# Patient Record
Sex: Male | Born: 1970
Health system: Southern US, Community
[De-identification: ages and names within clinical notes are randomized; demographics above are authoritative.]

## PROBLEM LIST (undated history)

## (undated) DIAGNOSIS — I10 Essential (primary) hypertension: Secondary | ICD-10-CM

## (undated) DIAGNOSIS — F419 Anxiety disorder, unspecified: Secondary | ICD-10-CM

## (undated) DIAGNOSIS — I2510 Atherosclerotic heart disease of native coronary artery without angina pectoris: Secondary | ICD-10-CM

## (undated) DIAGNOSIS — Z9289 Personal history of other medical treatment: Secondary | ICD-10-CM

## (undated) DIAGNOSIS — E785 Hyperlipidemia, unspecified: Secondary | ICD-10-CM

## (undated) DIAGNOSIS — Z720 Tobacco use: Secondary | ICD-10-CM

## (undated) HISTORY — PX: APPENDECTOMY: SHX54

## (undated) HISTORY — DX: Hyperlipidemia, unspecified: E78.5

## (undated) HISTORY — DX: Anxiety disorder, unspecified: F41.9

## (undated) HISTORY — DX: Personal history of other medical treatment: Z92.89

## (undated) HISTORY — DX: Atherosclerotic heart disease of native coronary artery without angina pectoris: I25.10

---

## 2006-05-16 ENCOUNTER — Observation Stay: Payer: Self-pay | Admitting: General Surgery

## 2006-06-15 ENCOUNTER — Ambulatory Visit: Payer: Self-pay | Admitting: General Surgery

## 2006-07-01 ENCOUNTER — Other Ambulatory Visit: Payer: Self-pay

## 2006-07-01 ENCOUNTER — Inpatient Hospital Stay: Payer: Self-pay | Admitting: Internal Medicine

## 2006-07-03 ENCOUNTER — Inpatient Hospital Stay: Payer: Self-pay | Admitting: Psychiatry

## 2007-03-11 ENCOUNTER — Ambulatory Visit: Payer: Self-pay | Admitting: Emergency Medicine

## 2008-01-12 ENCOUNTER — Ambulatory Visit: Payer: Self-pay | Admitting: Family Medicine

## 2008-05-08 ENCOUNTER — Ambulatory Visit: Payer: Self-pay | Admitting: Internal Medicine

## 2008-08-06 ENCOUNTER — Ambulatory Visit: Payer: Self-pay | Admitting: Internal Medicine

## 2010-04-13 ENCOUNTER — Ambulatory Visit: Payer: Self-pay | Admitting: Internal Medicine

## 2015-05-07 ENCOUNTER — Emergency Department
Admission: EM | Admit: 2015-05-07 | Discharge: 2015-05-07 | Disposition: A | Discharge status: Home / Self Care | Payer: 59 | Attending: Emergency Medicine | Admitting: Emergency Medicine

## 2015-05-07 ENCOUNTER — Encounter: Payer: Self-pay | Admitting: Emergency Medicine

## 2015-05-07 ENCOUNTER — Emergency Department: Payer: 59

## 2015-05-07 DIAGNOSIS — N50811 Right testicular pain: Secondary | ICD-10-CM | POA: Diagnosis not present

## 2015-05-07 DIAGNOSIS — Z880 Allergy status to penicillin: Secondary | ICD-10-CM | POA: Insufficient documentation

## 2015-05-07 DIAGNOSIS — R1031 Right lower quadrant pain: Secondary | ICD-10-CM | POA: Diagnosis present

## 2015-05-07 DIAGNOSIS — F17200 Nicotine dependence, unspecified, uncomplicated: Secondary | ICD-10-CM | POA: Diagnosis not present

## 2015-05-07 LAB — URINALYSIS COMPLETE WITH MICROSCOPIC (ARMC ONLY)
Bacteria, UA: NONE SEEN
Bilirubin Urine: NEGATIVE
GLUCOSE, UA: NEGATIVE mg/dL
Hgb urine dipstick: NEGATIVE
Ketones, ur: NEGATIVE mg/dL
Leukocytes, UA: NEGATIVE
Nitrite: NEGATIVE
PROTEIN: NEGATIVE mg/dL
SQUAMOUS EPITHELIAL / LPF: NONE SEEN
Specific Gravity, Urine: 1.006 (ref 1.005–1.030)
pH: 7 (ref 5.0–8.0)

## 2015-05-07 MED ORDER — TRAMADOL HCL 50 MG PO TABS
50.0000 mg | ORAL_TABLET | Freq: Four times a day (QID) | ORAL | Status: DC | PRN
Start: 2015-05-07 — End: 2016-05-19

## 2015-05-07 MED ORDER — CIPROFLOXACIN HCL 500 MG PO TABS: 500.0000 mg | ORAL_TABLET | Freq: Two times a day (BID) | ORAL | Status: AC

## 2015-05-07 MED ORDER — CLONIDINE HCL 0.2 MG PO TABS: 0.2000 mg | ORAL_TABLET | Freq: Two times a day (BID) | ORAL | Status: DC | PRN

## 2015-05-07 MED ORDER — CLONIDINE HCL 0.1 MG PO TABS
0.1000 mg | ORAL_TABLET | Freq: Once | ORAL | Status: AC
  Administered 2015-05-07: 0.1 mg via ORAL
  Filled 2015-05-07: qty 1

## 2015-05-07 NOTE — Discharge Instructions (Signed)
Abdominal Pain, Adult °Many things can cause belly (abdominal) pain. Most times, the belly pain is not dangerous. Many cases of belly pain can be watched and treated at home. °HOME CARE  °· Do not take medicines that help you go poop (laxatives) unless told to by your doctor. °· Only take medicine as told by your doctor. °· Eat or drink as told by your doctor. Your doctor will tell you if you should be on a special diet. °GET HELP IF: °· You do not know what is causing your belly pain. °· You have belly pain while you are sick to your stomach (nauseous) or have runny poop (diarrhea). °· You have pain while you pee or poop. °· Your belly pain wakes you up at night. °· You have belly pain that gets worse or better when you eat. °· You have belly pain that gets worse when you eat fatty foods. °· You have a fever. °GET HELP RIGHT AWAY IF:  °· The pain does not go away within 2 hours. °· You keep throwing up (vomiting). °· The pain changes and is only in the right or left part of the belly. °· You have bloody or tarry looking poop. °MAKE SURE YOU:  °· Understand these instructions. °· Will watch your condition. °· Will get help right away if you are not doing well or get worse. °  °This information is not intended to replace advice given to you by your health care provider. Make sure you discuss any questions you have with your health care provider. °  °Document Released: 10/22/2007 Document Revised: 05/26/2014 Document Reviewed: 01/12/2013 °Elsevier Interactive Patient Education ©2016 [employer] ° °Please return immediately if condition worsens. Please contact her primary physician or the physician you were given for referral. If you have any specialist physicians involved in her treatment and plan please also contact them. Thank you for using [facility]. ° °

## 2015-05-07 NOTE — ED Notes (Signed)
Pt complains of right groin pain, pt reports pain is worse when at the gym, pt reports pain has been present for 3 weeks, pt denies any other symptoms

## 2015-05-07 NOTE — ED Notes (Signed)
Patient transported to CT 

## 2015-05-07 NOTE — ED Notes (Signed)
Pt presents with possible hernia right side groin area for three weeks. Has appt with Dr. Katrinka BlazingSmith on 05/25/15, but cannot wait that long, increased pain. Denies any problems with  Passing urine.

## 2015-05-07 NOTE — ED Provider Notes (Signed)
Time Seen: Approximately *0 945  I have reviewed the triage notes  Chief Complaint: Hernia   History of Present Illness: Nathan Gray is a 44 y.o. male who states he's had some right groin pain and right lateral testicular pain now for the last several weeks. He describes episodes where it's exacerbated by bending over. He was advised that this may be a right inguinal hernia and does have follow-up appointment scheduled with a general surgeon. He denies any significant masses toward the right scrotal sac and denies any trauma. He denies any exacerbation of pain with urination or bowel movements. He denies any fever at home and states he's been taking ibuprofen for pain. He presents with some hypertension and states he has no history of high blood pressure. He denies any chest pain or shortness of breath. He denies any significant persistent abdominal pain. He denies any recent penile discharge or drainage or skin lesions.   History reviewed. No pertinent past medical history.  There are no active problems to display for this patient.   Past Surgical History  Procedure Laterality Date  . Appendectomy      Past Surgical History  Procedure Laterality Date  . Appendectomy      Current Outpatient Rx  Name  Route  Sig  Dispense  Refill  . ciprofloxacin (CIPRO) 500 MG tablet   Oral   Take 1 tablet (500 mg total) by mouth 2 (two) times daily.   14 tablet   0   . cloNIDine (CATAPRES) 0.2 MG tablet   Oral   Take 1 tablet (0.2 mg total) by mouth 2 (two) times daily as needed.   20 tablet   11   . traMADol (ULTRAM) 50 MG tablet   Oral   Take 1 tablet (50 mg total) by mouth every 6 (six) hours as needed.   20 tablet   0     Allergies:  Penicillins  Family History: No family history on file.  Social History: Social History  Substance Use Topics  . Smoking status: Current Some Day Smoker  . Smokeless tobacco: None  . Alcohol Use: Yes     Review of Systems:    10 point review of systems was performed and was otherwise negative:  Constitutional: No fever Eyes: No visual disturbances ENT: No sore throat, ear pain Cardiac: No chest pain Respiratory: No shortness of breath, wheezing, or stridor Abdomen: No abdominal pain, no vomiting, No diarrhea Endocrine: No weight loss, No night sweats Extremities: No peripheral edema, cyanosis Skin: No rashes, easy bruising Neurologic: No focal weakness, trouble with speech or swollowing Urologic: No dysuria, Hematuria, or urinary frequency   Physical Exam:  ED Triage Vitals  Enc Vitals Group     BP 05/07/15 0835 175/104 mmHg     Pulse Rate 05/07/15 0835 87     Resp 05/07/15 0835 18     Temp 05/07/15 0835 97.8 F (36.6 C)     Temp Source 05/07/15 0835 Oral     SpO2 05/07/15 0835 100 %     Weight 05/07/15 0835 160 lb (72.576 kg)     Height 05/07/15 0835  (1.803 m)     Head Cir --      Peak Flow --      Pain Score 05/07/15 0841 6     Pain Loc --      Pain Edu? --      Excl. in GC? --     General: Awake ,  Alert , and Oriented times 3; GCS 15 Head: Normal cephalic , atraumatic Eyes: Pupils equal , round, reactive to light Nose/Throat: No nasal drainage, patent upper airway without erythema or exudate.  Neck: Supple, Full range of motion, No anterior adenopathy or palpable thyroid masses Lungs: Clear to ascultation without wheezes , rhonchi, or rales Heart: Regular rate, regular rhythm without murmurs , gallops , or rubs Abdomen: Soft, non tender without rebound, guarding , or rigidity; bowel sounds positive and symmetric in all 4 quadrants. No organomegaly .        Extremities: 2 plus symmetric pulses. No edema, clubbing or cyanosis Neurologic: normal ambulation, Motor symmetric without deficits, sensory intact Skin: warm, dry, no rashes Patient has some mild tenderness in the right lateral surface of the scrotal sac. His testes appears to be normally positioned and nontender itself.  There is no palpable hernias within the scrotal sac.  Labs:   All laboratory work was reviewed including any pertinent negatives or positives listed below:  Labs Reviewed  URINALYSIS COMPLETEWITH MICROSCOPIC ([facility] ONLY) - Abnormal; Notable for the following:    Color, Urine YELLOW (*)    APPearance CLEAR (*)    All other components within normal limits       Radiology:  EXAM: CT ABDOMEN AND PELVIS WITHOUT CONTRAST  TECHNIQUE: Multidetector CT imaging of the abdomen and pelvis was performed following the standard protocol without IV contrast.  COMPARISON: None.  FINDINGS: The lung bases are clear. There is no pleural or pericardial effusion.  The kidneys appear normal bilaterally. No renal or ureteral stones are seen on the right or left. The gallbladder, liver, spleen, adrenal glands and pancreas all appear normal.  There is advanced for age appearing aortoiliac atherosclerosis without aneurysm. No lymphadenopathy or fluid is identified. The patient is status post appendectomy. The stomach and small and large bowel appear normal. No hernia is seen. No focal bony abnormality is identified.  IMPRESSION: No acute abnormality or finding to explain the patient's symptoms.  Advanced for age appearing aortoiliac atherosclerosis without aneurysm.       I personally reviewed the radiologic studies    ED Course:  Patient's stay here was uneventful in his urine and abdominal CT showed no abnormalities. Under consideration was a urinary tract infection, kidney stone, etc. He could have a hernia though it doesn't seem to be obvious on physical exam. He also has some mild tenderness on the lateral surface of his testicle and this may be some epididymitis or orchitis. He certainly doesn't have a fever or obvious swelling in the scrotal sac itself. Patient be placed on Cipro for precaution and was given Ultram for pain. He also was persistently hypertensive and was given  clonidine and will be discharged on clonidine for outpatient workup. He was advised continue with his appointment for hernia evaluation and referred to his primary physician for further evaluation of his hypertension. May be situational hypertension is patient's somewhat anxious and at times a very poor historian. He was advised to obtain some form of male supportive garment such as a Tourist information centre managerathletic supporter.     Assessment: * Right groin pain of uncertain etiology  Final Clinical Impression:   Final diagnoses:  Groin pain, right     Plan: * Outpatient management Patient was advised to return immediately if condition worsens. Patient was advised to follow up with their primary care physician or other specialized physicians involved in their outpatient care  Jennye Moccasin, MD 05/07/15 [phone]

## 2016-02-09 ENCOUNTER — Encounter (HOSPITAL_COMMUNITY): Payer: Self-pay

## 2016-02-09 ENCOUNTER — Emergency Department (HOSPITAL_COMMUNITY): Payer: 59

## 2016-02-09 ENCOUNTER — Emergency Department (HOSPITAL_COMMUNITY)
Admission: EM | Admit: 2016-02-09 | Discharge: 2016-02-09 | Disposition: A | Discharge status: Home / Self Care | Payer: 59 | Attending: Emergency Medicine | Admitting: Emergency Medicine

## 2016-02-09 DIAGNOSIS — Y939 Activity, unspecified: Secondary | ICD-10-CM | POA: Diagnosis not present

## 2016-02-09 DIAGNOSIS — Y92410 Unspecified street and highway as the place of occurrence of the external cause: Secondary | ICD-10-CM | POA: Insufficient documentation

## 2016-02-09 DIAGNOSIS — M25511 Pain in right shoulder: Secondary | ICD-10-CM

## 2016-02-09 DIAGNOSIS — S7011XA Contusion of right thigh, initial encounter: Secondary | ICD-10-CM | POA: Diagnosis not present

## 2016-02-09 DIAGNOSIS — F17200 Nicotine dependence, unspecified, uncomplicated: Secondary | ICD-10-CM | POA: Insufficient documentation

## 2016-02-09 DIAGNOSIS — S79921A Unspecified injury of right thigh, initial encounter: Secondary | ICD-10-CM | POA: Diagnosis present

## 2016-02-09 DIAGNOSIS — M79651 Pain in right thigh: Secondary | ICD-10-CM

## 2016-02-09 DIAGNOSIS — Y999 Unspecified external cause status: Secondary | ICD-10-CM | POA: Diagnosis not present

## 2016-02-09 DIAGNOSIS — R52 Pain, unspecified: Secondary | ICD-10-CM

## 2016-02-09 MED ORDER — OXYCODONE-ACETAMINOPHEN 5-325 MG PO TABS: 1.0000 | ORAL_TABLET | Freq: Four times a day (QID) | ORAL | 0 refills | Status: DC | PRN

## 2016-02-09 MED ORDER — HYDROCODONE-ACETAMINOPHEN 5-325 MG PO TABS: 1.0000 | ORAL_TABLET | Freq: Four times a day (QID) | ORAL | 0 refills | Status: DC | PRN

## 2016-02-09 MED ORDER — HYDROCODONE-ACETAMINOPHEN 5-325 MG PO TABS: 2.0000 | ORAL_TABLET | Freq: Once | ORAL | Status: DC
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
## 2016-05-19 ENCOUNTER — Ambulatory Visit
Admission: EM | Admit: 2016-05-19 | Discharge: 2016-05-19 | Disposition: A | Discharge status: Home / Self Care | Payer: 59 | Attending: Emergency Medicine | Admitting: Emergency Medicine

## 2016-05-19 DIAGNOSIS — R000 Tachycardia, unspecified: Secondary | ICD-10-CM | POA: Diagnosis not present

## 2016-05-19 DIAGNOSIS — F419 Anxiety disorder, unspecified: Secondary | ICD-10-CM

## 2016-05-19 MED ORDER — CITALOPRAM HYDROBROMIDE 20 MG PO TABS: 20.0000 mg | ORAL_TABLET | Freq: Every day | ORAL | Status: DC

## 2016-05-19 NOTE — ED Provider Notes (Signed)
CSN: [id number]     Arrival date & time 05/19/16  1039 History   First MD Initiated Contact with Patient 05/19/16 1216     Chief Complaint  Patient presents with  . Anxiety   (Consider location/radiation/quality/duration/timing/severity/associated sxs/prior Treatment) Patient is a healthy 46 year old male, with no medical history and not currently on any medication, presents today for anxiety and rapid heartbeat. 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 Patient also reports a lot of stress at home. Patient states that his brothers is in poor health with upcoming leg amputation scheduled. His mom and dad are also in poor health and his friend recently passed away due to cancer. Patient states that he has been stressed for the past 5 months and reports that it is finally catching up on him. Patient reports that he started noticing rapid heartbeat about 3 weeks ago. Patient states that the rapid heartbeat is intermittent. Patient denies any cardiac history. Patient denies chest pain, shortness of breath, headache, dizziness and visual disturbances. Besides rapid heartbeat, patient also endorses irritability, poor sleep at night, and feeling shaky at times.       History reviewed. No pertinent past medical history. Past Surgical History:  Procedure Laterality Date  . APPENDECTOMY     History reviewed. No pertinent family history. Social History  Substance Use Topics  . Smoking status: Current Every Day Smoker    Packs/day: 0.50  . Smokeless tobacco: Never Used  . Alcohol use Yes     Comment: occasionally    Review of Systems  Constitutional:       As stated in the history of present illness.    Allergies  Amoxicillin and Penicillins  Home Medications   Prior to Admission medications   Not on File   Meds Ordered and Administered this Visit  Medications - No data to display  BP (!) 189/110 (BP Location: Left Arm)   Pulse 100   Temp 98 F  (36.7 C) (Oral)   Resp 17   Ht 5\' 11"  (1.803 m)   Wt 167 lb (75.8 kg)   SpO2 100%   BMI 23.29 kg/m  No data found.   Physical Exam  Constitutional: He is oriented to person, place, and time. He appears well-developed and well-nourished. No distress.  HENT:  Head: Normocephalic and atraumatic.  Right Ear: External ear normal.  Left Ear: External ear normal.  Nose: Nose normal.  Mouth/Throat: Oropharynx is clear and moist.  Eyes: EOM are normal. Pupils are equal, round, and reactive to light.  Neck: Normal range of motion. Neck supple.  Cardiovascular: Normal rate, regular rhythm and normal heart sounds.   Has S1 S2 RRR, with no murmur, rub or gallop. Has intact distal pulses.   Pulmonary/Chest: Effort normal and breath sounds normal. No respiratory distress. He has no wheezes.  Abdominal: Soft. Bowel sounds are normal. He exhibits no distension. There is no tenderness.  Musculoskeletal: Normal range of motion.  Neurological: He is alert and oriented to person, place, and time.  Skin: Skin is warm and dry. He is not diaphoretic.  Nursing note and vitals reviewed.   Urgent Care Course   Clinical Course     Procedures (including critical care time)  Labs Review Labs Reviewed - No data to display  Imaging Review No results found.   MDM   1. Anxiety    Sinus rhythm on EKG. There is no axis deviation. There is no ectopy, no evidence of heart  block or conduction disturbance, no bundle branch block, no pathological Q wave, no ST segment deviation, no T wave inversion, no evidence of pre-excitation or prolonged QT interval and no evidence of chamber enlargement.   Physical exam unremarkable. Symptoms are consistent with anxiety. Will start patient on Celexa 20 mg daily for anxiety an Ativan 0.5 mg twice daily only as needed (4 tablets given). [facility] registry reviewed and is appropriate. Patient informed to establish care with a PCP and f/u in 1 week for re-evaluation.    **Unable to print off the prescription for both Celexa and Ativan. Hand written prescriptions given.     Lucia EstelleFeng Antawn Sison, NP 05/19/16 1254

## 2016-05-19 NOTE — ED Triage Notes (Signed)
Patient complains of rapid heart rate. Patient states that he has been anxious recently. Patient states that he has a lot of family stressors at the current moment. Patient states that he has noticed that he has been unable to sleep. Patient states that at time he also has some lightheaded.

## 2016-05-28 ENCOUNTER — Encounter: Payer: Self-pay | Admitting: Internal Medicine

## 2016-05-28 ENCOUNTER — Ambulatory Visit (INDEPENDENT_AMBULATORY_CARE_PROVIDER_SITE_OTHER): Payer: 59 | Admitting: Internal Medicine

## 2016-05-28 VITALS — BP 106/96 | HR 86 | Temp 97.6°F | Ht 71.0 in | Wt 153.0 lb

## 2016-05-28 DIAGNOSIS — F17200 Nicotine dependence, unspecified, uncomplicated: Secondary | ICD-10-CM

## 2016-05-28 DIAGNOSIS — F419 Anxiety disorder, unspecified: Secondary | ICD-10-CM | POA: Insufficient documentation

## 2016-05-28 DIAGNOSIS — Z720 Tobacco use: Secondary | ICD-10-CM | POA: Insufficient documentation

## 2016-05-28 DIAGNOSIS — G4709 Other insomnia: Secondary | ICD-10-CM | POA: Insufficient documentation

## 2016-05-28 MED ORDER — LORAZEPAM 0.5 MG PO TABS: 0.5000 mg | ORAL_TABLET | Freq: Every evening | ORAL | 0 refills | Status: DC | PRN

## 2016-05-28 MED ORDER — CITALOPRAM HYDROBROMIDE 20 MG PO TABS: 20.0000 mg | ORAL_TABLET | Freq: Every day | ORAL | 1 refills | Status: DC

## 2016-05-28 NOTE — Progress Notes (Signed)
Date:  05/28/2016   Name:  Nathan Gray   DOB:  [birth year]   MRN:  [id number]   Chief Complaint: Establish Care and Anxiety Anxiety  Presents for initial visit. Onset was 6 to 12 months ago. The problem has been gradually worsening. Symptoms include chest pain, insomnia, irritability, nausea, nervous/anxious behavior and palpitations. Patient reports no decreased concentration, dizziness, shortness of breath or suicidal ideas.    More stress recently with promotion at work, dear friend with terminal cancer, brother with severe diabetes and upcoming amputation. Started celexa 20 mg 10 days ago.  He increased to 40 mg 2 days ago. Sleep is still bad.  Queezy stomach is improved.  Now feels like he is clenching his teeth and can't be still. He has no history of similar sx.  No distinct family history.   Review of Systems  Constitutional: Positive for irritability. Negative for chills and fatigue.  Eyes: Negative for visual disturbance.  Respiratory: Negative for cough, chest tightness, shortness of breath and wheezing.   Cardiovascular: Positive for chest pain and palpitations.  Gastrointestinal: Positive for nausea. Negative for abdominal pain and vomiting.  Neurological: Negative for dizziness and headaches.  Psychiatric/Behavioral: Positive for sleep disturbance. Negative for decreased concentration, dysphoric mood, hallucinations and suicidal ideas. The patient is nervous/anxious and has insomnia.     There are no active problems to display for this patient.   Prior to Admission medications   Medication Sig Start Date End Date Taking? Authorizing Provider  citalopram (CELEXA) 20 MG tablet  05/19/16  Yes Historical Provider, MD  LORazepam (ATIVAN) 0.5 MG tablet  05/19/16   Historical Provider, MD    Allergies  Allergen Reactions  . Amoxicillin   . Penicillins     Past Surgical History:  Procedure Laterality Date  . APPENDECTOMY      Social History  Substance Use  Topics  . Smoking status: Current Every Day Smoker    Packs/day: 0.50    Years: 18.00  . Smokeless tobacco: Current User  . Alcohol use Yes     Comment: occasionally     Medication list has been reviewed and updated.   Physical Exam  Constitutional: He is oriented to person, place, and time. He appears well-developed and well-nourished. No distress.  HENT:  Head: Normocephalic and atraumatic.  Eyes: Pupils are equal, round, and reactive to light.  Neck: Normal range of motion. Neck supple. No thyromegaly present.  Cardiovascular: Normal rate, regular rhythm and normal heart sounds.   Pulmonary/Chest: Effort normal and breath sounds normal. No respiratory distress.  Musculoskeletal: Normal range of motion. He exhibits no edema.  Neurological: He is alert and oriented to person, place, and time. He has normal reflexes.  Skin: Skin is warm and dry. No rash noted.  Psychiatric: He has a normal mood and affect. His behavior is normal. Thought content normal.  Nursing note and vitals reviewed.   BP (!) 106/96   Pulse 86   Temp 97.6 F (36.4 C)   Ht 5\' 11"  (1.803 m)   Wt 153 lb (69.4 kg)   SpO2 98%   BMI 21.34 kg/m   Assessment and Plan: 1. Anxiety disorder, unspecified type Decrease celexa to 20 mg once a day Follow up in one month - citalopram (CELEXA) 20 MG tablet; Take 1 tablet (20 mg total) by mouth daily.  Dispense: 30 tablet; Refill: 1  2. Other insomnia Use ativan at hs as needed with goal to taper off over the next  30-45 days - LORazepam (ATIVAN) 0.5 MG tablet; Take 1 tablet (0.5 mg total) by mouth at bedtime as needed for anxiety.  Dispense: 30 tablet; Refill: 0   Bari EdwardLaura Earnest Mcgillis, MD [facility]  05/28/2016

## 2016-07-02 ENCOUNTER — Ambulatory Visit (INDEPENDENT_AMBULATORY_CARE_PROVIDER_SITE_OTHER): Payer: 59 | Admitting: Internal Medicine

## 2016-07-02 ENCOUNTER — Encounter: Payer: Self-pay | Admitting: Internal Medicine

## 2016-07-02 VITALS — BP 140/88 | HR 86 | Ht 71.0 in | Wt 156.0 lb

## 2016-07-02 DIAGNOSIS — G4709 Other insomnia: Secondary | ICD-10-CM | POA: Diagnosis not present

## 2016-07-02 DIAGNOSIS — F419 Anxiety disorder, unspecified: Secondary | ICD-10-CM | POA: Diagnosis not present

## 2016-07-02 MED ORDER — ZALEPLON 10 MG PO CAPS: 10.0000 mg | ORAL_CAPSULE | Freq: Every evening | ORAL | 2 refills | Status: DC | PRN

## 2016-07-02 MED ORDER — CITALOPRAM HYDROBROMIDE 20 MG PO TABS: 20.0000 mg | ORAL_TABLET | Freq: Every day | ORAL | 1 refills | Status: DC

## 2016-07-02 NOTE — Progress Notes (Signed)
Date:  07/02/2016   Name:  Nathan Gray   DOB:  01-12-1971   MRN:  [id number]   Chief Complaint: Anxiety (Pt states anxiety is better, but still having trouble sleeping.) Patient reports doing extremely well on Celexa 20 mg. Quite happy with his results, he is no longer anxious. He still has the same stressors as before but is coping with them well. His main complaint at this point his sleep. He takes Ativan 1 or 2 at bedtime and can sleep 6 hours waking refreshed. If he takes no medication he can only sleep about 3 hours. Sometimes it is exhausted he can sleep 6 hours without any medication.   Anxiety  Presents for follow-up visit. Symptoms include insomnia. Patient reports no dizziness, nervous/anxious behavior, palpitations or shortness of breath. Symptoms occur rarely. The severity of symptoms is mild. The quality of sleep is fair.    Insomnia  Primary symptoms: sleep disturbance, difficulty falling asleep, premature morning awakening.  The onset quality is undetermined. The symptoms are aggravated by SSRI use, work stress and anxiety. The symptoms are relieved by medication.    Review of Systems  Constitutional: Negative for chills, fatigue and unexpected weight change.  Respiratory: Negative for cough, chest tightness and shortness of breath.   Cardiovascular: Negative for palpitations.  Neurological: Negative for dizziness and headaches.  Psychiatric/Behavioral: Positive for sleep disturbance. The patient has insomnia. The patient is not nervous/anxious.     Patient Active Problem List   Diagnosis Date Noted  . Anxiety disorder 05/28/2016  . Other insomnia 05/28/2016  . Mild tobacco use disorder 05/28/2016    Prior to Admission medications   Medication Sig Start Date End Date Taking? Authorizing Provider  citalopram (CELEXA) 20 MG tablet Take 1 tablet (20 mg total) by mouth daily. 05/28/16  Yes Reubin MilanLaura H Kenadie Royce, MD  LORazepam (ATIVAN) 0.5 MG tablet Take 1 tablet (0.5  mg total) by mouth at bedtime as needed for anxiety. 05/28/16  Yes Reubin MilanLaura H Markcus Lazenby, MD    Allergies  Allergen Reactions  . Amoxicillin   . Penicillins     Past Surgical History:  Procedure Laterality Date  . APPENDECTOMY      Social History  Substance Use Topics  . Smoking status: Current Every Day Smoker    Packs/day: 0.50    Years: 18.00  . Smokeless tobacco: Current User  . Alcohol use Yes     Comment: occasionally     Medication list has been reviewed and updated.   Physical Exam  Constitutional: He is oriented to person, place, and time. He appears well-developed. No distress.  HENT:  Head: Normocephalic and atraumatic.  Neck: Normal range of motion. Neck supple. No thyromegaly present.  Cardiovascular: Normal rate, regular rhythm and normal heart sounds.   Pulmonary/Chest: Effort normal and breath sounds normal. No respiratory distress. He has no wheezes.  Musculoskeletal: Normal range of motion.  Neurological: He is alert and oriented to person, place, and time.  Skin: Skin is warm and dry. No rash noted.  Psychiatric: He has a normal mood and affect. His behavior is normal. Thought content normal.  Nursing note and vitals reviewed.   BP (!) 162/110   Pulse 86   Ht 5\' 11"  (1.803 m)   Wt 156 lb (70.8 kg)   SpO2 97%   BMI 21.76 kg/m   Assessment and Plan: 1. Anxiety disorder, unspecified type Doing well - continue current therapy Consider taper and DC in 3-6 months if doing  well - citalopram (CELEXA) 20 MG tablet; Take 1 tablet (20 mg total) by mouth daily.  Dispense: 90 tablet; Refill: 1  2. Other insomnia Hold Ativan; try sonata - zaleplon (SONATA) 10 MG capsule; Take 1 capsule (10 mg total) by mouth at bedtime as needed for sleep.  Dispense: 30 capsule; Refill: 2   Bari Edward, MD [facility]  07/02/2016

## 2016-09-29 ENCOUNTER — Ambulatory Visit: Payer: 59 | Admitting: Internal Medicine

## 2016-10-28 ENCOUNTER — Ambulatory Visit: Payer: 59 | Admitting: Internal Medicine

## 2016-11-11 ENCOUNTER — Ambulatory Visit (INDEPENDENT_AMBULATORY_CARE_PROVIDER_SITE_OTHER): Payer: 59 | Admitting: Internal Medicine

## 2016-11-11 ENCOUNTER — Encounter: Payer: Self-pay | Admitting: Internal Medicine

## 2016-11-11 VITALS — BP 150/110 | HR 72 | Ht 71.0 in | Wt 159.4 lb

## 2016-11-11 DIAGNOSIS — R030 Elevated blood-pressure reading, without diagnosis of hypertension: Secondary | ICD-10-CM

## 2016-11-11 DIAGNOSIS — F419 Anxiety disorder, unspecified: Secondary | ICD-10-CM | POA: Diagnosis not present

## 2016-11-11 MED ORDER — CITALOPRAM HYDROBROMIDE 20 MG PO TABS: 30.0000 mg | ORAL_TABLET | Freq: Every day | ORAL | 1 refills | Status: DC

## 2016-11-11 NOTE — Patient Instructions (Signed)
Check BP three times a week at home - record the readings and return for recheck with home BP cuff if readings are consistently >140/90

## 2016-11-11 NOTE — Progress Notes (Signed)
Date:  11/11/2016   Name:  Nathan Gray   DOB:  05/09/1971   MRN:  [id number]   Chief Complaint: Anxiety (Feeling like past 3 weeks has not been last all day long. Otherwise, feeling fine. But wants it to last all day.  ) Anxiety  Presents for follow-up visit. Symptoms include nausea and nervous/anxious behavior. Patient reports no chest pain, dizziness, palpitations, shortness of breath or suicidal ideas. Symptoms occur most days. The severity of symptoms is mild. The quality of sleep is good.   Compliance with medications is 76-100%.  He has had some family health issues lately that may also be bothering him.  He feels fairly anxious in the morning but that resolved by 10 AM (he takes medication in AM).    Review of Systems  Constitutional: Negative for chills, fatigue, fever and unexpected weight change.  Respiratory: Negative for chest tightness, shortness of breath and wheezing.   Cardiovascular: Negative for chest pain, palpitations and leg swelling.  Gastrointestinal: Positive for nausea. Negative for abdominal pain.  Musculoskeletal: Negative for arthralgias.  Neurological: Negative for dizziness and headaches.  Psychiatric/Behavioral: Negative for dysphoric mood, sleep disturbance and suicidal ideas. The patient is nervous/anxious.     Patient Active Problem List   Diagnosis Date Noted  . Anxiety disorder 05/28/2016  . Other insomnia 05/28/2016  . Mild tobacco use disorder 05/28/2016    Prior to Admission medications   Medication Sig Start Date End Date Taking? Authorizing Provider  citalopram (CELEXA) 20 MG tablet Take 1 tablet (20 mg total) by mouth daily. 07/02/16  Yes Reubin Milan, MD    Allergies  Allergen Reactions  . Amoxicillin   . Penicillins     Past Surgical History:  Procedure Laterality Date  . APPENDECTOMY      Social History  Substance Use Topics  . Smoking status: Current Every Day Smoker    Packs/day: 0.50    Years: 18.00  .  Smokeless tobacco: Current User  . Alcohol use Yes     Comment: occasionally     Medication list has been reviewed and updated.   Physical Exam  Constitutional: He is oriented to person, place, and time. He appears well-developed. No distress.  HENT:  Head: Normocephalic and atraumatic.  Cardiovascular: Normal rate, regular rhythm and normal heart sounds.   Pulmonary/Chest: Effort normal and breath sounds normal. No respiratory distress.  Musculoskeletal: Normal range of motion.  Neurological: He is alert and oriented to person, place, and time.  Skin: Skin is warm and dry. No rash noted.  Psychiatric: He has a normal mood and affect. His behavior is normal. Thought content normal.  Nursing note and vitals reviewed.   BP (!) 162/112   Pulse 72   Ht 5\' 11"  (1.803 m)   Wt 159 lb 6.4 oz (72.3 kg)   SpO2 99%   BMI 22.23 kg/m   Assessment and Plan: 1. Anxiety disorder, unspecified type Increase to 30 mg per day - citalopram (CELEXA) 20 MG tablet; Take 1.5 tablets (30 mg total) by mouth daily.  Dispense: 135 tablet; Refill: 1  2. Elevated blood pressure reading Pt to monitor at home Unclear if situational or benign essential htn Recommend screening bloodwork - pt declines and will bring labs from annual employer screening   Meds ordered this encounter  Medications  . citalopram (CELEXA) 20 MG tablet    Sig: Take 1.5 tablets (30 mg total) by mouth daily.    Dispense:  135 tablet  Refill:  1    Bari EdwardLaura Eveline Sauve, MD [facility]  11/11/2016

## 2017-01-04 ENCOUNTER — Other Ambulatory Visit: Payer: Self-pay | Admitting: Internal Medicine

## 2017-01-04 DIAGNOSIS — F419 Anxiety disorder, unspecified: Secondary | ICD-10-CM

## 2017-01-05 NOTE — Telephone Encounter (Signed)
Called pt and left a message.

## 2017-01-26 DIAGNOSIS — M25531 Pain in right wrist: Secondary | ICD-10-CM | POA: Diagnosis not present

## 2017-01-26 DIAGNOSIS — S6991XA Unspecified injury of right wrist, hand and finger(s), initial encounter: Secondary | ICD-10-CM | POA: Diagnosis not present

## 2017-01-26 DIAGNOSIS — S66911A Strain of unspecified muscle, fascia and tendon at wrist and hand level, right hand, initial encounter: Secondary | ICD-10-CM | POA: Diagnosis not present

## 2017-02-05 DIAGNOSIS — S63511A Sprain of carpal joint of right wrist, initial encounter: Secondary | ICD-10-CM | POA: Diagnosis not present

## 2017-07-07 ENCOUNTER — Other Ambulatory Visit: Payer: Self-pay | Admitting: Internal Medicine

## 2017-07-07 DIAGNOSIS — F419 Anxiety disorder, unspecified: Secondary | ICD-10-CM

## 2017-07-07 NOTE — Telephone Encounter (Signed)
lvm to set up appt

## 2017-07-30 ENCOUNTER — Other Ambulatory Visit: Payer: Self-pay

## 2017-07-30 ENCOUNTER — Encounter: Payer: Self-pay | Admitting: *Deleted

## 2017-07-30 ENCOUNTER — Ambulatory Visit
Admission: EM | Admit: 2017-07-30 | Discharge: 2017-07-30 | Disposition: A | Discharge status: Home / Self Care | Payer: 59 | Attending: Family Medicine | Admitting: Family Medicine

## 2017-07-30 ENCOUNTER — Ambulatory Visit (INDEPENDENT_AMBULATORY_CARE_PROVIDER_SITE_OTHER): Payer: 59

## 2017-07-30 DIAGNOSIS — R2231 Localized swelling, mass and lump, right upper limb: Secondary | ICD-10-CM | POA: Diagnosis not present

## 2017-07-30 DIAGNOSIS — M25511 Pain in right shoulder: Secondary | ICD-10-CM

## 2017-07-30 DIAGNOSIS — M7989 Other specified soft tissue disorders: Secondary | ICD-10-CM | POA: Diagnosis not present

## 2017-07-30 MED ORDER — MELOXICAM 15 MG PO TABS: 15.0000 mg | ORAL_TABLET | Freq: Every day | ORAL | 0 refills | Status: DC | PRN

## 2017-07-30 NOTE — ED Provider Notes (Signed)
[facility]    CSN: [id number] Arrival date & time: 07/30/17  1016  History   Chief Complaint Chief Complaint  Patient presents with  . Shoulder Pain   HPI  47 year old male [facility]  Started 1.5 weeks ago.  States that he slept wrong.  Since then he has had pain and swelling around the right Verdi joint.  No reports of actual shoulder pain.  Normal range of motion.  He been taking ibuprofen with some improvement but no resolution.  His pain is currently 5/10 in severity.  No known exacerbating factors.  No recent fall, trauma, injury.  No other reported symptoms.  No other complaints concerns at this time.  Past Medical History:  Diagnosis Date  . Anxiety    Patient Active Problem List   Diagnosis Date Noted  . Anxiety disorder 05/28/2016  . Other insomnia 05/28/2016  . Mild tobacco use disorder 05/28/2016   Past Surgical History:  Procedure Laterality Date  . APPENDECTOMY     Home Medications    Prior to Admission medications   Medication Sig Start Date End Date Taking? Authorizing Provider  citalopram (CELEXA) 20 MG tablet TAKE 1 TABLET (20 MG TOTAL) BY MOUTH DAILY. 07/07/17  Yes Reubin MilanBerglund, Laura H, MD  meloxicam (MOBIC) 15 MG tablet Take 1 tablet (15 mg total) by mouth daily as needed. 07/30/17   Tommie Samsook, Lenell Lama G, DO    Family History Family History  Problem Relation Age of Onset  . Hypertension Mother   . Diabetes Father   . Diabetes Brother   . Cancer Paternal Grandfather     Social History Social History   Tobacco Use  . Smoking status: Current Every Day Smoker    Packs/day: 0.50    Years: 18.00    Pack years: 9.00  . Smokeless tobacco: Current User  Substance Use Topics  . Alcohol use: Yes    Comment: occasionally  . Drug use: No    Allergies   Amoxicillin and Penicillins  Review of Systems Review of Systems  Respiratory: Negative.   Musculoskeletal:       Pain clavicle/Schleicher joint.   Physical Exam Triage  Vital Signs ED Triage Vitals  Enc Vitals Group     BP 07/30/17 1031 (!) 187/111     Pulse Rate 07/30/17 1031 75     Resp 07/30/17 1031 16     Temp 07/30/17 1031 98.1 F (36.7 C)     Temp Source 07/30/17 1031 Oral     SpO2 07/30/17 1031 100 %     Weight 07/30/17 1037 165 lb (74.8 kg)     Height 07/30/17 1037 5\' 11"  (1.803 m)     Head Circumference --      Peak Flow --      Pain Score 07/30/17 1037 5     Pain Loc --      Pain Edu? --      Excl. in GC? --    Updated Vital Signs BP (!) 187/111 (BP Location: Left Arm)   Pulse 75   Temp 98.1 F (36.7 C) (Oral)   Resp 16   Ht 5\' 11"  (1.803 m)   Wt 165 lb (74.8 kg)   SpO2 100%   BMI 23.01 kg/m     Physical Exam  Constitutional: He is oriented to person, place, and time. He appears well-developed. No distress.  Cardiovascular: Normal rate and regular rhythm.  Pulmonary/Chest: Effort normal and breath sounds normal. He  has no wheezes. He has no rales.  Musculoskeletal:  Prominence noted at the right [place] joint.  Mildly tender to palpation. Normal range of motion of the shoulder.  Negative Hawkins test.  Neurological: He is alert and oriented to person, place, and time.  Psychiatric: He has a normal mood and affect. His behavior is normal.  Nursing note and vitals reviewed.  UC Treatments / Results  Labs (all labs ordered are listed, but only abnormal results are displayed) Labs Reviewed - No data to display  EKG  EKG Interpretation None       Radiology Dg Clavicle Right  Result Date: 07/30/2017 CLINICAL DATA:  Sternoclavicular joint swelling and pain. EXAM: RIGHT CLAVICLE - 2+ VIEWS COMPARISON:  None. FINDINGS: There is no evidence of fracture or other focal bone lesions. Soft tissues are unremarkable. IMPRESSION: No osseous abnormality. Electronically Signed   By: Delbert Phenix M.D.   On: 07/30/2017 11:13    Procedures Procedures (including critical care time)  Medications Ordered in UC Medications - No data to  display   Initial Impression / Assessment and Plan / UC Course  I have reviewed the triage vital signs and the nursing notes.  Pertinent labs & imaging results that were available during my care of the patient were reviewed by me and considered in my medical decision making (see chart for details).     47 year old male presents with pain and swelling at the [facility] joint.  X-ray negative.  Treating with meloxicam.  If fails to improve or worsens, should see orthopedics.  Final Clinical Impressions(s) / UC Diagnoses   Final diagnoses:  Pain of right sternoclavicular joint    ED Discharge Orders        Ordered    meloxicam (MOBIC) 15 MG tablet  Daily PRN     07/30/17 1134     Controlled Substance Prescriptions [place] Controlled Substance Registry consulted? Not Applicable   Tommie Sams, DO 07/30/17 1143

## 2017-07-30 NOTE — ED Triage Notes (Signed)
Patient started having unexplained right shoulder pain 1.5 weeks ago. Swelling is visible on the right collar bone. Patient reports he awoke with the pain. No previous history of shoulder problems.

## 2017-07-30 NOTE — Discharge Instructions (Signed)
Xray negative.  Medication as needed.  If persists, see Ortho.

## 2017-08-19 ENCOUNTER — Emergency Department: Payer: 59

## 2017-08-19 ENCOUNTER — Inpatient Hospital Stay
Admission: EM | Admit: 2017-08-19 | Discharge: 2017-08-21 | DRG: 247 | Disposition: A | Discharge status: Home / Self Care | Payer: 59 | Attending: Internal Medicine | Admitting: Internal Medicine

## 2017-08-19 ENCOUNTER — Encounter: Payer: Self-pay | Admitting: Emergency Medicine

## 2017-08-19 ENCOUNTER — Encounter
Admission: EM | Disposition: A | Discharge status: Home / Self Care | Payer: Self-pay | Source: Home / Self Care | Attending: Internal Medicine

## 2017-08-19 DIAGNOSIS — I25110 Atherosclerotic heart disease of native coronary artery with unstable angina pectoris: Secondary | ICD-10-CM | POA: Diagnosis present

## 2017-08-19 DIAGNOSIS — I119 Hypertensive heart disease without heart failure: Secondary | ICD-10-CM | POA: Diagnosis present

## 2017-08-19 DIAGNOSIS — Z880 Allergy status to penicillin: Secondary | ICD-10-CM | POA: Diagnosis not present

## 2017-08-19 DIAGNOSIS — Z8249 Family history of ischemic heart disease and other diseases of the circulatory system: Secondary | ICD-10-CM | POA: Diagnosis not present

## 2017-08-19 DIAGNOSIS — Z79899 Other long term (current) drug therapy: Secondary | ICD-10-CM | POA: Diagnosis not present

## 2017-08-19 DIAGNOSIS — I10 Essential (primary) hypertension: Secondary | ICD-10-CM

## 2017-08-19 DIAGNOSIS — I214 Non-ST elevation (NSTEMI) myocardial infarction: Secondary | ICD-10-CM | POA: Diagnosis not present

## 2017-08-19 DIAGNOSIS — F419 Anxiety disorder, unspecified: Secondary | ICD-10-CM | POA: Diagnosis present

## 2017-08-19 DIAGNOSIS — F17210 Nicotine dependence, cigarettes, uncomplicated: Secondary | ICD-10-CM | POA: Diagnosis present

## 2017-08-19 DIAGNOSIS — R079 Chest pain, unspecified: Secondary | ICD-10-CM | POA: Diagnosis not present

## 2017-08-19 DIAGNOSIS — I213 ST elevation (STEMI) myocardial infarction of unspecified site: Secondary | ICD-10-CM | POA: Diagnosis not present

## 2017-08-19 DIAGNOSIS — R0602 Shortness of breath: Secondary | ICD-10-CM | POA: Diagnosis not present

## 2017-08-19 DIAGNOSIS — Z720 Tobacco use: Secondary | ICD-10-CM | POA: Diagnosis not present

## 2017-08-19 DIAGNOSIS — I252 Old myocardial infarction: Secondary | ICD-10-CM | POA: Diagnosis present

## 2017-08-19 HISTORY — DX: Essential (primary) hypertension: I10

## 2017-08-19 HISTORY — DX: Tobacco use: Z72.0

## 2017-08-19 HISTORY — PX: LEFT HEART CATH AND CORONARY ANGIOGRAPHY: CATH118249

## 2017-08-19 HISTORY — PX: CORONARY STENT INTERVENTION: CATH118234

## 2017-08-19 LAB — BASIC METABOLIC PANEL
ANION GAP: 10 (ref 5–15)
BUN: 14 mg/dL (ref 6–20)
CALCIUM: 9.1 mg/dL (ref 8.9–10.3)
CO2: 25 mmol/L (ref 22–32)
Chloride: 104 mmol/L (ref 101–111)
Creatinine, Ser: 0.88 mg/dL (ref 0.61–1.24)
Glucose, Bld: 82 mg/dL (ref 65–99)
Potassium: 3.7 mmol/L (ref 3.5–5.1)
Sodium: 139 mmol/L (ref 135–145)

## 2017-08-19 LAB — CBC
HCT: 43.9 % (ref 40.0–52.0)
HEMOGLOBIN: 15.2 g/dL (ref 13.0–18.0)
MCH: 32.3 pg (ref 26.0–34.0)
MCHC: 34.5 g/dL (ref 32.0–36.0)
MCV: 93.4 fL (ref 80.0–100.0)
Platelets: 222 10*3/uL (ref 150–440)
RBC: 4.7 MIL/uL (ref 4.40–5.90)
RDW: 12.7 % (ref 11.5–14.5)
WBC: 7.1 10*3/uL (ref 3.8–10.6)

## 2017-08-19 LAB — POCT ACTIVATED CLOTTING TIME
Activated Clotting Time: 241 seconds
Activated Clotting Time: 268 seconds

## 2017-08-19 LAB — TROPONIN I
TROPONIN I: 1.18 ng/mL — AB (ref <0.03)
Troponin I: 2.06 ng/mL (ref <0.03)

## 2017-08-19 LAB — TSH: TSH: 2.326 u[IU]/mL (ref 0.350–4.500)

## 2017-08-19 SURGERY — LEFT HEART CATH AND CORONARY ANGIOGRAPHY
Anesthesia: Moderate Sedation

## 2017-08-19 MED ORDER — SODIUM CHLORIDE 0.9% FLUSH: 3.0000 mL | Freq: Two times a day (BID) | INTRAVENOUS | Status: DC

## 2017-08-19 MED ORDER — ACETAMINOPHEN 325 MG PO TABS: 650.0000 mg | ORAL_TABLET | ORAL | Status: DC | PRN

## 2017-08-19 MED ORDER — CITALOPRAM HYDROBROMIDE 20 MG PO TABS
20.0000 mg | ORAL_TABLET | Freq: Every day | ORAL | Status: DC
  Administered 2017-08-20 – 2017-08-21 (×2): 20 mg via ORAL
  Filled 2017-08-19 (×2): qty 1

## 2017-08-19 MED ORDER — TICAGRELOR 90 MG PO TABS
ORAL_TABLET | ORAL | Status: DC | PRN
  Administered 2017-08-19: 180 mg via ORAL

## 2017-08-19 MED ORDER — MIDAZOLAM HCL 2 MG/2ML IJ SOLN
INTRAMUSCULAR | Status: DC | PRN
  Administered 2017-08-19 (×2): 1 mg via INTRAVENOUS

## 2017-08-19 MED ORDER — HYDRALAZINE HCL 20 MG/ML IJ SOLN
INTRAMUSCULAR | Status: AC
  Administered 2017-08-19: 5 mg via INTRAVENOUS
  Filled 2017-08-19: qty 1

## 2017-08-19 MED ORDER — FUROSEMIDE 10 MG/ML IJ SOLN
20.0000 mg | Freq: Once | INTRAMUSCULAR | Status: AC
  Administered 2017-08-19: 20 mg via INTRAVENOUS

## 2017-08-19 MED ORDER — METOPROLOL TARTRATE 25 MG PO TABS
25.0000 mg | ORAL_TABLET | Freq: Two times a day (BID) | ORAL | Status: DC
  Administered 2017-08-19: 25 mg via ORAL
  Filled 2017-08-19: qty 1

## 2017-08-19 MED ORDER — NITROGLYCERIN 0.4 MG SL SUBL
SUBLINGUAL_TABLET | SUBLINGUAL | Status: AC
  Administered 2017-08-19: 0.4 mg via SUBLINGUAL
  Filled 2017-08-19: qty 1

## 2017-08-19 MED ORDER — ONDANSETRON HCL 4 MG/2ML IJ SOLN: 4.0000 mg | Freq: Four times a day (QID) | INTRAMUSCULAR | Status: DC | PRN

## 2017-08-19 MED ORDER — FUROSEMIDE 10 MG/ML IJ SOLN
INTRAMUSCULAR | Status: AC
  Filled 2017-08-19: qty 2

## 2017-08-19 MED ORDER — VERAPAMIL HCL 2.5 MG/ML IV SOLN
INTRAVENOUS | Status: DC | PRN
  Administered 2017-08-19: 2.5 mg via INTRA_ARTERIAL

## 2017-08-19 MED ORDER — NITROGLYCERIN 2 % TD OINT
1.0000 [in_us] | TOPICAL_OINTMENT | Freq: Once | TRANSDERMAL | Status: AC
  Administered 2017-08-19: 1 [in_us] via TOPICAL
  Filled 2017-08-19: qty 1

## 2017-08-19 MED ORDER — IOPAMIDOL (ISOVUE-300) INJECTION 61%
INTRAVENOUS | Status: DC | PRN
  Administered 2017-08-19: 185 mL via INTRA_ARTERIAL

## 2017-08-19 MED ORDER — SODIUM CHLORIDE 0.9% FLUSH: 3.0000 mL | INTRAVENOUS | Status: DC | PRN

## 2017-08-19 MED ORDER — CARVEDILOL 6.25 MG PO TABS
6.2500 mg | ORAL_TABLET | Freq: Two times a day (BID) | ORAL | Status: DC
  Administered 2017-08-20 – 2017-08-21 (×3): 6.25 mg via ORAL
  Filled 2017-08-19 (×3): qty 1

## 2017-08-19 MED ORDER — TICAGRELOR 90 MG PO TABS
90.0000 mg | ORAL_TABLET | Freq: Two times a day (BID) | ORAL | Status: DC
  Administered 2017-08-20 – 2017-08-21 (×3): 90 mg via ORAL
  Filled 2017-08-19 (×3): qty 1

## 2017-08-19 MED ORDER — HEPARIN SODIUM (PORCINE) 1000 UNIT/ML IJ SOLN
INTRAMUSCULAR | Status: DC | PRN
  Administered 2017-08-19: 2000 [IU] via INTRAVENOUS
  Administered 2017-08-19: 4000 [IU] via INTRAVENOUS
  Administered 2017-08-19: 3000 [IU] via INTRAVENOUS
  Administered 2017-08-19: 4000 [IU] via INTRAVENOUS

## 2017-08-19 MED ORDER — ATORVASTATIN CALCIUM 20 MG PO TABS: 40.0000 mg | ORAL_TABLET | Freq: Every day | ORAL | Status: DC

## 2017-08-19 MED ORDER — SODIUM CHLORIDE 0.9% FLUSH
3.0000 mL | Freq: Two times a day (BID) | INTRAVENOUS | Status: DC
  Administered 2017-08-19 – 2017-08-21 (×4): 3 mL via INTRAVENOUS

## 2017-08-19 MED ORDER — NITROGLYCERIN 1 MG/10 ML FOR IR/CATH LAB
INTRA_ARTERIAL | Status: DC | PRN
  Administered 2017-08-19 (×2): 200 ug via INTRACORONARY

## 2017-08-19 MED ORDER — ASPIRIN 81 MG PO CHEW
81.0000 mg | CHEWABLE_TABLET | Freq: Every day | ORAL | Status: DC
  Administered 2017-08-20 – 2017-08-21 (×2): 81 mg via ORAL
  Filled 2017-08-19 (×2): qty 1

## 2017-08-19 MED ORDER — VERAPAMIL HCL 2.5 MG/ML IV SOLN
INTRAVENOUS | Status: AC
  Filled 2017-08-19: qty 2

## 2017-08-19 MED ORDER — MIDAZOLAM HCL 2 MG/2ML IJ SOLN
1.0000 mg | Freq: Once | INTRAMUSCULAR | Status: AC
  Administered 2017-08-19: 1 mg via INTRAVENOUS

## 2017-08-19 MED ORDER — SODIUM CHLORIDE 0.9 % IV SOLN
INTRAVENOUS | Status: DC
  Administered 2017-08-19: 1000 mL via INTRAVENOUS

## 2017-08-19 MED ORDER — ONDANSETRON HCL 4 MG PO TABS
4.0000 mg | ORAL_TABLET | Freq: Four times a day (QID) | ORAL | Status: DC | PRN
  Filled 2017-08-19: qty 1

## 2017-08-19 MED ORDER — HEPARIN (PORCINE) IN NACL 2-0.9 UNIT/ML-% IJ SOLN
INTRAMUSCULAR | Status: AC
  Filled 2017-08-19: qty 1000

## 2017-08-19 MED ORDER — ASPIRIN 81 MG PO CHEW
324.0000 mg | CHEWABLE_TABLET | Freq: Once | ORAL | Status: AC
  Administered 2017-08-19: 324 mg via ORAL
  Filled 2017-08-19: qty 4

## 2017-08-19 MED ORDER — HEPARIN (PORCINE) IN NACL 100-0.45 UNIT/ML-% IJ SOLN
1000.0000 [IU]/h | INTRAMUSCULAR | Status: DC
  Administered 2017-08-19: 1000 [IU]/h via INTRAVENOUS
  Filled 2017-08-19: qty 250

## 2017-08-19 MED ORDER — LABETALOL HCL 5 MG/ML IV SOLN
10.0000 mg | INTRAVENOUS | Status: DC | PRN
  Administered 2017-08-19: 10 mg via INTRAVENOUS
  Filled 2017-08-19: qty 4

## 2017-08-19 MED ORDER — SODIUM CHLORIDE 0.9 % IV SOLN: INTRAVENOUS | Status: DC

## 2017-08-19 MED ORDER — ACETAMINOPHEN 325 MG PO TABS: 650.0000 mg | ORAL_TABLET | Freq: Four times a day (QID) | ORAL | Status: DC | PRN

## 2017-08-19 MED ORDER — ENOXAPARIN SODIUM 40 MG/0.4ML ~~LOC~~ SOLN
40.0000 mg | SUBCUTANEOUS | Status: DC
  Administered 2017-08-20: 40 mg via SUBCUTANEOUS
  Filled 2017-08-19 (×2): qty 0.4

## 2017-08-19 MED ORDER — NITROGLYCERIN 5 MG/ML IV SOLN
INTRAVENOUS | Status: AC
  Filled 2017-08-19: qty 10

## 2017-08-19 MED ORDER — LABETALOL HCL 5 MG/ML IV SOLN: 10.0000 mg | INTRAVENOUS | Status: DC | PRN

## 2017-08-19 MED ORDER — TICAGRELOR 90 MG PO TABS
ORAL_TABLET | ORAL | Status: AC
  Filled 2017-08-19: qty 1

## 2017-08-19 MED ORDER — HYDRALAZINE HCL 20 MG/ML IJ SOLN: 10.0000 mg | INTRAMUSCULAR | Status: DC | PRN

## 2017-08-19 MED ORDER — SODIUM CHLORIDE 0.9 % IV SOLN: 250.0000 mL | INTRAVENOUS | Status: DC | PRN

## 2017-08-19 MED ORDER — FENTANYL CITRATE (PF) 100 MCG/2ML IJ SOLN
INTRAMUSCULAR | Status: DC | PRN
  Administered 2017-08-19: 25 ug via INTRAVENOUS
  Administered 2017-08-19: 50 ug via INTRAVENOUS

## 2017-08-19 MED ORDER — MIDAZOLAM HCL 2 MG/2ML IJ SOLN
INTRAMUSCULAR | Status: AC
  Filled 2017-08-19: qty 2

## 2017-08-19 MED ORDER — NITROGLYCERIN 0.4 MG SL SUBL
0.4000 mg | SUBLINGUAL_TABLET | SUBLINGUAL | Status: DC | PRN
  Administered 2017-08-19 (×2): 0.4 mg via SUBLINGUAL

## 2017-08-19 MED ORDER — ACETAMINOPHEN 650 MG RE SUPP
650.0000 mg | Freq: Four times a day (QID) | RECTAL | Status: DC | PRN
  Filled 2017-08-19: qty 1

## 2017-08-19 MED ORDER — TRAMADOL HCL 50 MG PO TABS: 50.0000 mg | ORAL_TABLET | Freq: Four times a day (QID) | ORAL | Status: DC | PRN

## 2017-08-19 MED ORDER — HEPARIN SODIUM (PORCINE) 1000 UNIT/ML IJ SOLN
INTRAMUSCULAR | Status: AC
  Filled 2017-08-19: qty 1

## 2017-08-19 MED ORDER — ATORVASTATIN CALCIUM 20 MG PO TABS
80.0000 mg | ORAL_TABLET | Freq: Every day | ORAL | Status: DC
  Administered 2017-08-20: 80 mg via ORAL
  Filled 2017-08-19: qty 4

## 2017-08-19 MED ORDER — FENTANYL CITRATE (PF) 100 MCG/2ML IJ SOLN
INTRAMUSCULAR | Status: AC
  Filled 2017-08-19: qty 2

## 2017-08-19 MED ORDER — HYDRALAZINE HCL 20 MG/ML IJ SOLN
5.0000 mg | INTRAMUSCULAR | Status: AC | PRN
  Administered 2017-08-19: 5 mg via INTRAVENOUS

## 2017-08-19 MED ORDER — HEPARIN BOLUS VIA INFUSION
4000.0000 [IU] | Freq: Once | INTRAVENOUS | Status: AC
  Administered 2017-08-19: 4000 [IU] via INTRAVENOUS
  Filled 2017-08-19: qty 4000

## 2017-08-19 SURGICAL SUPPLY — 15 items
BALLN TREK RX 2.5X12 (BALLOONS) ×2
BALLN ~~LOC~~ EUPHORA RX 3.25X15 (BALLOONS) ×2
BALLOON TREK RX 2.5X12 (BALLOONS) IMPLANT
BALLOON ~~LOC~~ EUPHORA RX 3.25X15 (BALLOONS) IMPLANT
CATH INFINITI 5FR ANG PIGTAIL (CATHETERS) ×1 IMPLANT
CATH INFINITI 5FR TG (CATHETERS) ×1 IMPLANT
CATH VISTA GUIDE 6FR JR4 (CATHETERS) ×1 IMPLANT
DEVICE INFLAT 30 PLUS (MISCELLANEOUS) ×1 IMPLANT
DEVICE RAD TR BAND REGULAR (VASCULAR PRODUCTS) ×1 IMPLANT
GLIDESHEATH SLEND SS 6F .021 (SHEATH) ×1 IMPLANT
KIT MANI 3VAL PERCEP (MISCELLANEOUS) ×2 IMPLANT
PACK CARDIAC CATH (CUSTOM PROCEDURE TRAY) ×2 IMPLANT
STENT SIERRA 3.00 X 28 MM (Permanent Stent) ×1 IMPLANT
WIRE G HI TQ BMW 190 (WIRE) ×1 IMPLANT
WIRE ROSEN-J .035X260CM (WIRE) ×1 IMPLANT

## 2017-08-19 NOTE — Consult Note (Signed)
Cardiology Consult    Patient ID: Nathan Gray MRN: [id number], DOB/AGE: [birth year]   Admit date: 08/19/2017 Date of Consult: 08/19/2017  Primary Physician: Reubin Milan, MD Primary Cardiologist: Yvonne Kendall, MD - new Requesting Provider: S. Allena Katz, MD  Patient Profile    Nathan Gray is a 47 y.o. male with a history of anxiety, tob abuse, and untreated HTN, who is being seen today for the evaluation of NSTEMI and c/p at the request of Dr. Allena Katz.  Past Medical History   Past Medical History:  Diagnosis Date  . Anxiety   . Hypertension   . Tobacco abuse     Past Surgical History:  Procedure Laterality Date  . APPENDECTOMY       Allergies  Allergies  Allergen Reactions  . Amoxicillin   . Penicillins     History of Present Illness    47 y/o ? with a h/o anxiety, tob abuse, and untreated HTN.  He also has a fairly significant FH of CAD with his father suffering an MI in his mid-50's and subsequently required CABG.  He lives locally and is active but does not routinely exercise.  He was in his usual state of health until 4/2, when around 10 am, he developed severe sscp associated with dyspnea.  This lasted about 30 mins and resolved spontaneously.  Symptoms recurred multiple times throughout the day, each time associated with dyspnea, lasting up to 30 minutes, and resolving spontaneously.  Symptoms could occur either at rest or with exertion.  He had no further episodes after about 6 PM yesterday evening but then this morning had recurrent chest pain which prompted him to present to the emergency department.  Here, he was markedly hypertensive with a blood pressure of 182/114.  ECG suggests LVH with mild inferior ST depression and T changes.  Troponin has returned at 1.18.  He is currently chest pain-free.  Inpatient Medications    . heparin  4,000 Units Intravenous Once    Family History    Family History  Problem Relation Age of Onset  . Hypertension  Mother   . Diabetes Father   . Heart attack Father        first MI in mid-50's  . CAD Father        s/p CABG  . Diabetes Brother   . Cancer Paternal Grandfather    indicated that his mother is alive. He indicated that his father is alive. He indicated that his brother is alive. He indicated that the status of his paternal grandfather is unknown.   Social History    Social History   Socioeconomic History  . Marital status: Single    Spouse name: Not on file  . Number of children: Not on file  . Years of education: Not on file  . Highest education level: Not on file  Occupational History  . Occupation: Probation officer  Social Needs  . Financial resource strain: Not on file  . Food insecurity:    Worry: Not on file    Inability: Not on file  . Transportation needs:    Medical: Not on file    Non-medical: Not on file  Tobacco Use  . Smoking status: Current Every Day Smoker    Packs/day: 0.50    Years: 18.00    Pack years: 9.00  . Smokeless tobacco: Current User  Substance and Sexual Activity  . Alcohol use: Yes    Comment: few drinks/week  . Drug use: Yes  Types: Marijuana    Comment: smoked mj for first time in 20 yrs about 1 wk ago.  Marland Kitchen. Sexual activity: Not on file  Lifestyle  . Physical activity:    Days per week: Not on file    Minutes per session: Not on file  . Stress: Not on file  Relationships  . Social connections:    Talks on phone: Not on file    Gets together: Not on file    Attends religious service: Not on file    Active member of club or organization: Not on file    Attends meetings of clubs or organizations: Not on file    Relationship status: Not on file  . Intimate partner violence:    Fear of current or ex partner: Not on file    Emotionally abused: Not on file    Physically abused: Not on file    Forced sexual activity: Not on file  Other Topics Concern  . Not on file  Social History Narrative   [place]  Active but does not  routinely exercise.     Review of Systems    General:  No chills, fever, night sweats or weight changes.  Cardiovascular:  +++ intermittent chest pain assoc w/ dyspnea, no edema, orthopnea, palpitations, paroxysmal nocturnal dyspnea. Dermatological: No rash, lesions/masses Respiratory: No cough, dyspnea Urologic: No hematuria, dysuria Abdominal:   No nausea, vomiting, diarrhea, bright red blood per rectum, melena, or hematemesis Neurologic:  No visual changes, wkns, changes in mental status. All other systems reviewed and are otherwise negative except as noted above.  Physical Exam    Blood pressure (!) 182/114, pulse 80, temperature (!) 97.4 F (36.3 C), temperature source Oral, resp. rate 18, height 5\' 11"  (1.803 m), weight 178 lb (80.7 kg), SpO2 99 %.  General: Pleasant, NAD Psych: Normal affect. Neuro: Alert and oriented X 3. Moves all extremities spontaneously. HEENT: Normal  Neck: Supple without bruits or JVD. Lungs:  Resp regular and unlabored, CTA. Heart: RRR no s3, s4, or murmurs. Abdomen: Soft, non-tender, non-distended, BS + x 4.  Extremities: No clubbing, cyanosis or edema. DP/PT/Radials 2+ and equal bilaterally.  Labs     Recent Labs    08/19/17 0949  TROPONINI 1.18*   Lab Results  Component Value Date   WBC 7.1 08/19/2017   HGB 15.2 08/19/2017   HCT 43.9 08/19/2017   MCV 93.4 08/19/2017   PLT 222 08/19/2017    Recent Labs  Lab 08/19/17 0949  NA 139  K 3.7  CL 104  CO2 25  BUN 14  CREATININE 0.88  CALCIUM 9.1  GLUCOSE 82    Radiology Studies    Dg Chest 2 View  Result Date: 08/19/2017 CLINICAL DATA:  Chest pain over the last few days. EXAM: CHEST - 2 VIEW COMPARISON:  07/02/2006 FINDINGS: Heart size is normal. Mediastinal shadows are normal. The lungs are clear. No bronchial thickening. No infiltrate, mass, effusion or collapse. Pulmonary vascularity is normal. No bony abnormality. IMPRESSION: Normal chest Electronically Signed   By: Paulina FusiMark   Shogry M.D.   On: 08/19/2017 10:24   Dg Clavicle Right  Result Date: 07/30/2017 CLINICAL DATA:  Sternoclavicular joint swelling and pain. EXAM: RIGHT CLAVICLE - 2+ VIEWS COMPARISON:  None. FINDINGS: There is no evidence of fracture or other focal bone lesions. Soft tissues are unremarkable. IMPRESSION: No osseous abnormality. Electronically Signed   By: Delbert PhenixJason A Poff M.D.   On: 07/30/2017 11:13    ECG & Cardiac Imaging  Regular sinus rhythm, 87, left atrial enlargement, mild inferolateral ST depression with biphasic T waves.  Underlying LVH.  Assessment & Plan    1.  Non-ST segment elevation myocardial infarction: Patient without prior cardiac history though, he does have significant family history for premature CAD, presents to the [facility] regional emergency department with a 1 day history of intermittent rest and exertional substernal chest pressure associated with dyspnea, lasting approximately 30 minutes, and resolving spontaneously.  He had recurrent symptoms this morning, prompting presentation.  Here, he was noted to be markedly hypertensive with a troponin of 1.18.  ECG with subtle inferior ST changes.  No ST elevation.  He is currently chest pain-free.  Given intermittent symptoms and rest angina, we will plan on diagnostic catheterization this afternoon.  The patient understands that risks include but are not limited to stroke (1 in 1000), death (1 in 1000), kidney failure [usually temporary] (1 in 500), bleeding (1 in 200), allergic reaction [possibly serious] (1 in 200), and agrees to proceed.  He has already received aspirin.  Heparin ordered.  Adding high potency statin therapy and beta-blocker in the setting of ongoing hypertension.  Will likely plan to add ARB tomorrow pending renal function post cath.  Eventual cardiac rehab.  2.  Hypertensive urgency: Looking back through records, it appears that he has had elevated blood pressures in the past.  He is not on any antihypertensives.   Blood pressure 182/114 on presentation.  Adding carvedilol and I will also place an order for as needed IV hydralazine.  Nitropaste initiated.  Will likely plan to add ARB tomorrow.  3.  Lipid status: Currently unknown.  High potency statin therapy in the setting of ACS.  Follow-up lipids and LFTs in the a.m.  4.  Tobacco abuse: Complete cessation advised.  He will need additional counseling while here in the hospital.  5.  Anxiety: Continue home dose of Celexa.  Signed, Nicolasa Ducking, NP 08/19/2017, 11:27 AM  For questions or updates, please contact   Please consult [email or web address] for contact info under Cardiology/STEMI.

## 2017-08-19 NOTE — Progress Notes (Signed)
ANTICOAGULATION CONSULT NOTE - Initial Consult  Pharmacy Consult for heparin drip  Indication: chest pain/ACS  Allergies  Allergen Reactions  . Amoxicillin   . Penicillins     Patient Measurements: Height: 5\' 11"  (180.3 cm) Weight: 178 lb (80.7 kg) IBW/kg (Calculated) : 75.3 Heparin Dosing Weight: 80.7 kg  Vital Signs: Temp: 97.4 F (36.3 C) (04/03 0944) Temp Source: Oral (04/03 0944) BP: 182/114 (04/03 0947) Pulse Rate: 80 (04/03 0947)  Labs: Recent Labs    08/19/17 0949  HGB 15.2  HCT 43.9  PLT 222  CREATININE 0.88  TROPONINI 1.18*    Estimated Creatinine Clearance: 114.1 mL/min (by C-G formula based on SCr of 0.88 mg/dL).   Medical History: Past Medical History:  Diagnosis Date  . Anxiety     Assessment: 47 year old male presents to ED with chest pain and SOB. Current medication list does not indicate he has was previously anticoagulant/antiplatelet therapy.   Goal of Therapy:  Heparin level 0.3-0.7 units/ml Monitor platelets by anticoagulation protocol: Yes   Plan:  Will start with heparin bolus 4000 units followed by heparin drip 1000 units/hr. Will check first heparin level in 6 hours.  Will continue to follow daily CBC and monitor heparin per protocol.   Cleopatra CedarStephanie Vernica Wachtel, PharmD Pharmacy Resident  08/19/2017,11:06 AM

## 2017-08-19 NOTE — ED Notes (Signed)
ED Provider at bedside. 

## 2017-08-19 NOTE — Progress Notes (Signed)
Cardiology Post-Catheterization Note  Date: 08/19/17 Time: 7:30 PM  Subjective: Pt assessed this evening, as he developed 3/10 chest pressure and shortness of breath while getting up to void. He was also quite hypertensive at the time. Upon my assessment, Mr. Jae DireBottoms appears anxious with mild tachypnea. He reports continued 3/10 chest pressure, as well as transient tingling in the left hand. Chest pressure improved form 3/10->2/10 with SL NTG x 2. Furosemide 20 mg IV x 1 also given, as LVEDP was mildly elevated during catheterization.  Objective: Temp:  [97.4 F (36.3 C)-98 F (36.7 C)] 98 F (36.7 C) (04/03 1440) Pulse Rate:  [67-80] 78 (04/03 1915) Resp:  [13-20] 19 (04/03 1915) BP: (136-182)/(83-114) 136/101 (04/03 1915) SpO2:  [97 %-100 %] 99 % (04/03 1915) Weight:  [178 lb (80.7 kg)] 178 lb (80.7 kg) (04/03 1440) Gen: Anxious. Neck: No JVD. Heart: RRR w/o murmurs or rubs. Lungs: CTAB. Mildly tachypneic. Abd: soft, NT/ND. Ext: no LE edema. Right radial arteriotomy with TR band in place. No bleeding or hematoma. Neuro: 5/5 left hand strength.  EKG: Multiple EKG's performed, showing NSR with poor R-wave progression and non-specific ST changes. No ST elevation. Inferior ST/T changes improved compared with pre-cath.  Limited bedside echo: Hyperdynamic LV without regional wall motion abnormalities. Normal RV function. No pericardial effusion. No MR or AI.  Assessment/Plan: 47 y/o man admitted with NSTEMI, found to have severe proximal/mid RCA disease now s/p PCI to proximal and mid RCA. He developed chest pressure and shortness of breath ~1.5 hour after coming to the recovery area. Exam, echo, and limited bedside echo show no significant abnormalities. Symptoms are likely multifactorial, including possible vasospasm, hypoperfusion of jailed RV marginal branch, diastolic heart failure, and anxiety. Pt was just given midazolam 1 mg IV x 1. Continue with BP control and gentle diuresis. No  indication for repeat catheterization at this time.  Yvonne Kendallhristopher Karim Aiello, MD [facility] Pager: [phone]

## 2017-08-19 NOTE — ED Triage Notes (Signed)
Pt states he began having cp yesterday while he was at work with some shob, that travels to his left arm, here today with the same. Anxious in triage.

## 2017-08-19 NOTE — OR Nursing (Signed)
Up to bathroom to void without difficulty.

## 2017-08-19 NOTE — ED Provider Notes (Signed)
[facility] Emergency Department Provider Note       Time seen: ----------------------------------------- 9:56 AM on 08/19/2017 -----------------------------------------   I have reviewed the triage vital signs and the nursing notes.  HISTORY   Chief Complaint Chest Pain    HPI Nathan Gray is a 47 y.o. male with a history of anxiety who presents to the ED for chest pain that began while he was at work yesterday.  He is also had some shortness of breath.  Patient states he tried taking Tums for the symptoms but it did not help.  He denies a history of same, denies fevers, chills, current shortness of breath, vomiting or diarrhea.  Past Medical History:  Diagnosis Date  . Anxiety     Patient Active Problem List   Diagnosis Date Noted  . Anxiety disorder 05/28/2016  . Other insomnia 05/28/2016  . Mild tobacco use disorder 05/28/2016    Past Surgical History:  Procedure Laterality Date  . APPENDECTOMY      Allergies Amoxicillin and Penicillins  Social History Social History   Tobacco Use  . Smoking status: Current Every Day Smoker    Packs/day: 0.50    Years: 18.00    Pack years: 9.00  . Smokeless tobacco: Current User  Substance Use Topics  . Alcohol use: Yes    Comment: occasionally  . Drug use: No   Review of Systems Constitutional: Negative for fever. Cardiovascular: Positive for chest pain Respiratory: Positive for recent shortness of breath Gastrointestinal: Negative for abdominal pain, vomiting and diarrhea. Musculoskeletal: Negative for back pain. Skin: Negative for rash. Neurological: Negative for headaches, focal weakness or numbness.  All systems negative/normal/unremarkable except as stated in the HPI  ____________________________________________   PHYSICAL EXAM:  VITAL SIGNS: ED Triage Vitals  Enc Vitals Group     BP 08/19/17 0947 (!) 182/114     Pulse Rate 08/19/17 0947 80     Resp 08/19/17 0944 18    Temp 08/19/17 0944 (!) 97.4 F (36.3 C)     Temp Source 08/19/17 0944 Oral     SpO2 08/19/17 0947 99 %     Weight 08/19/17 0945 178 lb (80.7 kg)     Height 08/19/17 0945 5\' 11"  (1.803 m)     Head Circumference --      Peak Flow --      Pain Score 08/19/17 0945 1     Pain Loc --      Pain Edu? --      Excl. in GC? --    Constitutional: Alert and oriented. Well appearing and in no distress. Eyes: Conjunctivae are normal. Normal extraocular movements. ENT   Head: Normocephalic and atraumatic.   Nose: No congestion/rhinnorhea.   Mouth/Throat: Mucous membranes are moist.   Neck: No stridor. Cardiovascular: Normal rate, regular rhythm. No murmurs, rubs, or gallops. Respiratory: Normal respiratory effort without tachypnea nor retractions. Breath sounds are clear and equal bilaterally. No wheezes/rales/rhonchi. Gastrointestinal: Soft and nontender. Normal bowel sounds Musculoskeletal: Nontender with normal range of motion in extremities. No lower extremity tenderness nor edema. Neurologic:  Normal speech and language. No gross focal neurologic deficits are appreciated.  Skin:  Skin is warm, dry and intact. No rash noted. Psychiatric: Mood and affect are normal. Speech and behavior are normal.  ____________________________________________  EKG: Interpreted by me.  Sinus rhythm with a rate of 87 bpm, normal PR interval, normal QRS size, nonspecific ST and T wave changes  ____________________________________________  ED COURSE:  As part  of my medical decision making, I reviewed the following data within the electronic MEDICAL RECORD NUMBER History obtained from family if available, nursing notes, old chart and ekg, as well as notes from prior ED visits. Patient presented for chest pain, we will assess with labs and imaging as indicated at this time.   Procedures ____________________________________________   LABS (pertinent positives/negatives)  Labs Reviewed  TROPONIN I -  Abnormal; Notable for the following components:      Result Value   Troponin I 1.18 (*)    All other components within normal limits  BASIC METABOLIC PANEL  CBC  URINE DRUG SCREEN, QUALITATIVE ([facility] ONLY)    RADIOLOGY Images were viewed by me  Chest x-ray IMPRESSION: Normal chest  ____________________________________________  DIFFERENTIAL DIAGNOSIS   Musculoskeletal pain, anxiety, PE, unstable angina  FINAL ASSESSMENT AND PLAN  Chest pain, elevated troponin   Plan: The patient had presented for chest pain. Patient's labs were concerning for acute coronary syndrome.  Troponin was 1.18. Patient's imaging was unremarkable.  Patient's EKG as abnormal and his troponin is elevated.  He is not currently having any chest pain.  We gave him aspirin, nitroglycerin and placed him on a heparin drip.  I will discussed with cardiology.   Ulice Dash, MD   Note: This note was generated in part or whole with voice recognition software. Voice recognition is usually quite accurate but there are transcription errors that can and very often do occur. I apologize for any typographical errors that were not detected and corrected.     Emily Filbert, MD 08/19/17 1049

## 2017-08-19 NOTE — OR Nursing (Signed)
Dr End notified, stop IV fluid, give hydrazaline and 20 mg IV Lasix

## 2017-08-19 NOTE — OR Nursing (Signed)
Dr. End at bedside. 

## 2017-08-19 NOTE — H&P (View-Only) (Signed)
Cardiology Consult    Patient ID: Nathan Gray MRN: [id number], DOB/AGE: [birth year]   Admit date: 08/19/2017 Date of Consult: 08/19/2017  Primary Physician: Reubin Milan, MD Primary Cardiologist: Yvonne Kendall, MD - new Requesting Provider: S. Allena Katz, MD  Patient Profile    Nathan Gray is a 47 y.o. male with a history of anxiety, tob abuse, and untreated HTN, who is being seen today for the evaluation of NSTEMI and c/p at the request of Dr. Allena Katz.  Past Medical History   Past Medical History:  Diagnosis Date  . Anxiety   . Hypertension   . Tobacco abuse     Past Surgical History:  Procedure Laterality Date  . APPENDECTOMY       Allergies  Allergies  Allergen Reactions  . Amoxicillin   . Penicillins     History of Present Illness    47 y/o ? with a h/o anxiety, tob abuse, and untreated HTN.  He also has a fairly significant FH of CAD with his father suffering an MI in his mid-50's and subsequently required CABG.  He lives locally and is active but does not routinely exercise.  He was in his usual state of health until 4/2, when around 10 am, he developed severe sscp associated with dyspnea.  This lasted about 30 mins and resolved spontaneously.  Symptoms recurred multiple times throughout the day, each time associated with dyspnea, lasting up to 30 minutes, and resolving spontaneously.  Symptoms could occur either at rest or with exertion.  He had no further episodes after about 6 PM yesterday evening but then this morning had recurrent chest pain which prompted him to present to the emergency department.  Here, he was markedly hypertensive with a blood pressure of 182/114.  ECG suggests LVH with mild inferior ST depression and T changes.  Troponin has returned at 1.18.  He is currently chest pain-free.  Inpatient Medications    . heparin  4,000 Units Intravenous Once    Family History    Family History  Problem Relation Age of Onset  . Hypertension  Mother   . Diabetes Father   . Heart attack Father        first MI in mid-50's  . CAD Father        s/p CABG  . Diabetes Brother   . Cancer Paternal Grandfather    indicated that his mother is alive. He indicated that his father is alive. He indicated that his brother is alive. He indicated that the status of his paternal grandfather is unknown.   Social History    Social History   Socioeconomic History  . Marital status: Single    Spouse name: Not on file  . Number of children: Not on file  . Years of education: Not on file  . Highest education level: Not on file  Occupational History  . Occupation: Probation officer  Social Needs  . Financial resource strain: Not on file  . Food insecurity:    Worry: Not on file    Inability: Not on file  . Transportation needs:    Medical: Not on file    Non-medical: Not on file  Tobacco Use  . Smoking status: Current Every Day Smoker    Packs/day: 0.50    Years: 18.00    Pack years: 9.00  . Smokeless tobacco: Current User  Substance and Sexual Activity  . Alcohol use: Yes    Comment: few drinks/week  . Drug use: Yes  Types: Marijuana    Comment: smoked mj for first time in 20 yrs about 1 wk ago.  Marland Kitchen. Sexual activity: Not on file  Lifestyle  . Physical activity:    Days per week: Not on file    Minutes per session: Not on file  . Stress: Not on file  Relationships  . Social connections:    Talks on phone: Not on file    Gets together: Not on file    Attends religious service: Not on file    Active member of club or organization: Not on file    Attends meetings of clubs or organizations: Not on file    Relationship status: Not on file  . Intimate partner violence:    Fear of current or ex partner: Not on file    Emotionally abused: Not on file    Physically abused: Not on file    Forced sexual activity: Not on file  Other Topics Concern  . Not on file  Social History Narrative   [place] in [place].  Active but does not  routinely exercise.     Review of Systems    General:  No chills, fever, night sweats or weight changes.  Cardiovascular:  +++ intermittent chest pain assoc w/ dyspnea, no edema, orthopnea, palpitations, paroxysmal nocturnal dyspnea. Dermatological: No rash, lesions/masses Respiratory: No cough, dyspnea Urologic: No hematuria, dysuria Abdominal:   No nausea, vomiting, diarrhea, bright red blood per rectum, melena, or hematemesis Neurologic:  No visual changes, wkns, changes in mental status. All other systems reviewed and are otherwise negative except as noted above.  Physical Exam    Blood pressure (!) 182/114, pulse 80, temperature (!) 97.4 F (36.3 C), temperature source Oral, resp. rate 18, height 5\' 11"  (1.803 m), weight 178 lb (80.7 kg), SpO2 99 %.  General: Pleasant, NAD Psych: Normal affect. Neuro: Alert and oriented X 3. Moves all extremities spontaneously. HEENT: Normal  Neck: Supple without bruits or JVD. Lungs:  Resp regular and unlabored, CTA. Heart: RRR no s3, s4, or murmurs. Abdomen: Soft, non-tender, non-distended, BS + x 4.  Extremities: No clubbing, cyanosis or edema. DP/PT/Radials 2+ and equal bilaterally.  Labs     Recent Labs    08/19/17 0949  TROPONINI 1.18*   Lab Results  Component Value Date   WBC 7.1 08/19/2017   HGB 15.2 08/19/2017   HCT 43.9 08/19/2017   MCV 93.4 08/19/2017   PLT 222 08/19/2017    Recent Labs  Lab 08/19/17 0949  NA 139  K 3.7  CL 104  CO2 25  BUN 14  CREATININE 0.88  CALCIUM 9.1  GLUCOSE 82    Radiology Studies    Dg Chest 2 View  Result Date: 08/19/2017 CLINICAL DATA:  Chest pain over the last few days. EXAM: CHEST - 2 VIEW COMPARISON:  07/02/2006 FINDINGS: Heart size is normal. Mediastinal shadows are normal. The lungs are clear. No bronchial thickening. No infiltrate, mass, effusion or collapse. Pulmonary vascularity is normal. No bony abnormality. IMPRESSION: Normal chest Electronically Signed   By: Paulina FusiMark   Shogry M.D.   On: 08/19/2017 10:24   Dg Clavicle Right  Result Date: 07/30/2017 CLINICAL DATA:  Sternoclavicular joint swelling and pain. EXAM: RIGHT CLAVICLE - 2+ VIEWS COMPARISON:  None. FINDINGS: There is no evidence of fracture or other focal bone lesions. Soft tissues are unremarkable. IMPRESSION: No osseous abnormality. Electronically Signed   By: Delbert PhenixJason A Poff M.D.   On: 07/30/2017 11:13    ECG & Cardiac Imaging  Regular sinus rhythm, 87, left atrial enlargement, mild inferolateral ST depression with biphasic T waves.  Underlying LVH.  Assessment & Plan    1.  Non-ST segment elevation myocardial infarction: Patient without prior cardiac history though, he does have significant family history for premature CAD, presents to the [facility] regional emergency department with a 1 day history of intermittent rest and exertional substernal chest pressure associated with dyspnea, lasting approximately 30 minutes, and resolving spontaneously.  He had recurrent symptoms this morning, prompting presentation.  Here, he was noted to be markedly hypertensive with a troponin of 1.18.  ECG with subtle inferior ST changes.  No ST elevation.  He is currently chest pain-free.  Given intermittent symptoms and rest angina, we will plan on diagnostic catheterization this afternoon.  The patient understands that risks include but are not limited to stroke (1 in 1000), death (1 in 1000), kidney failure [usually temporary] (1 in 500), bleeding (1 in 200), allergic reaction [possibly serious] (1 in 200), and agrees to proceed.  He has already received aspirin.  Heparin ordered.  Adding high potency statin therapy and beta-blocker in the setting of ongoing hypertension.  Will likely plan to add ARB tomorrow pending renal function post cath.  Eventual cardiac rehab.  2.  Hypertensive urgency: Looking back through records, it appears that he has had elevated blood pressures in the past.  He is not on any antihypertensives.   Blood pressure 182/114 on presentation.  Adding carvedilol and I will also place an order for as needed IV hydralazine.  Nitropaste initiated.  Will likely plan to add ARB tomorrow.  3.  Lipid status: Currently unknown.  High potency statin therapy in the setting of ACS.  Follow-up lipids and LFTs in the a.m.  4.  Tobacco abuse: Complete cessation advised.  He will need additional counseling while here in the hospital.  5.  Anxiety: Continue home dose of Celexa.  Signed, Nicolasa Ducking, NP 08/19/2017, 11:27 AM  For questions or updates, please contact   Please consult [email or web address] for contact info under Cardiology/STEMI.

## 2017-08-19 NOTE — Interval H&P Note (Signed)
History and Physical Interval Note:  08/19/2017 1:15 PM  Nathan Gray  has presented today for cardiac catheterization, with the diagnosis of NSTEMI. The various methods of treatment have been discussed with the patient and family. After consideration of risks, benefits and other options for treatment, the patient has consented to  Procedure(s): LEFT HEART CATH AND CORONARY ANGIOGRAPHY (N/A) as a surgical intervention .  The patient's history has been reviewed, patient examined, no change in status, stable for surgery.  I have reviewed the patient's chart and labs.  Questions were answered to the patient's satisfaction.    Cath Lab Visit (complete for each Cath Lab visit)  Clinical Evaluation Leading to the Procedure:   ACS: Yes.    Non-ACS:  N/A  Jiyah Torpey

## 2017-08-19 NOTE — OR Nursing (Signed)
Continued note. Pt reported sob and 3/10 chest pressure. Started on oxygen 2 liters and 12 lead EKG obtained

## 2017-08-19 NOTE — OR Nursing (Signed)
Pain almost gone and breathing still "feels weird".

## 2017-08-19 NOTE — Brief Op Note (Signed)
BRIEF CARDIAC CATHETERIZATION NOTE  DATE: 08/19/2017 TIME: 5:07 PM  PATIENT:  Nathan Gray  47 y.o. male  PRE-OPERATIVE DIAGNOSIS:  NSTEMI  POST-OPERATIVE DIAGNOSIS:  NSTEMI  PROCEDURE:  Procedure(s): LEFT HEART CATH AND CORONARY ANGIOGRAPHY (N/A)  SURGEON:  Surgeon(s) and Role:    * Tammy Ericsson, Cristal Deerhristopher, MD - Primary  FINDINGS: 1. Severe single-vessel CAD with 95% mid RCA stenosis. 2. Mildly elevated LVEDP with normal left ventricular contraction. 3. Successful PCI to proximal/mid RCA using Xience Sierra 3.0 x 28 mm DES with 0% residual stenosis and TIMI-3 flow.  RECOMMENDATIONS: 1. DAPT with ASA and ticagrelor for at least 12 months. 2. Aggressive secondary prevention. 3. Obtain transthoracic echo.  Nathan Kendallhristopher Mylin Gignac, MD [facility] Pager: [phone]

## 2017-08-19 NOTE — H&P (Signed)
[facility] [place] at [facility]   PATIENT NAME: Nathan Gray    MR#:  [id number]  DATE OF BIRTH:  02-24-1971  DATE OF ADMISSION:  08/19/2017  PRIMARY CARE PHYSICIAN: Nathan MilanBerglund, Laura H, MD   REQUESTING/REFERRING PHYSICIAN: Dr. Roxan Gray  CHIEF COMPLAINT:  chest pain on and off this morning  HISTORY OF PRESENT ILLNESS:  Nathan Gray  is a 47 y.o. male with a known history of anxiety, tobacco abuse, hypertension not on any medications comes to the emergency room with chest pain started off this morning. Patient came to the emergency room found to have STT changes in inferior lateral leads. His troponin was 1.18. He's being admitted with acute NST EMI. Cardiology Dr. Serita Gray's outpatient and recommends cardiac catheterization. Patient has been started on aspirin Nitro IV heparin drip beta blockers and statins.  PAST MEDICAL HISTORY:   Past Medical History:  Diagnosis Date  . Anxiety   . Hypertension   . Tobacco abuse     PAST SURGICAL HISTOIRY:   Past Surgical History:  Procedure Laterality Date  . APPENDECTOMY      SOCIAL HISTORY:   Social History   Tobacco Use  . Smoking status: Current Every Day Smoker    Packs/day: 0.50    Years: 18.00    Pack years: 9.00  . Smokeless tobacco: Current User  Substance Use Topics  . Alcohol use: Yes    Comment: few drinks/week    FAMILY HISTORY:   Family History  Problem Relation Age of Onset  . Hypertension Mother   . Diabetes Father   . Heart attack Father        first MI in mid-50's  . CAD Father        s/p CABG  . Diabetes Brother   . Cancer Paternal Grandfather     DRUG ALLERGIES:   Allergies  Allergen Reactions  . Amoxicillin   . Penicillins     Has patient had a PCN reaction causing immediate rash, facial/tongue/throat swelling, SOB or lightheadedness with hypotension: Unknown Has patient had a PCN reaction causing severe rash involving mucus membranes or skin necrosis:  Unknown Has patient had a PCN reaction that required hospitalization: Unknown Has patient had a PCN reaction occurring within the last 10 years: Unknown If all of the above answers are "NO", then may proceed with Cephalosporin use.    REVIEW OF SYSTEMS:  Review of Systems  Constitutional: Negative for chills, fever and weight loss.  HENT: Negative for ear discharge, ear pain and nosebleeds.   Eyes: Negative for blurred vision, pain and discharge.  Respiratory: Negative for sputum production, shortness of breath, wheezing and stridor.   Cardiovascular: Positive for chest pain. Negative for palpitations, orthopnea and PND.  Gastrointestinal: Negative for abdominal pain, diarrhea, nausea and vomiting.  Genitourinary: Negative for frequency and urgency.  Musculoskeletal: Negative for back pain and joint pain.  Neurological: Negative for sensory change, speech change, focal weakness and weakness.  Psychiatric/Behavioral: Negative for depression and hallucinations. The patient is nervous/anxious.      MEDICATIONS AT HOME:   Prior to Admission medications   Medication Sig Start Date End Date Taking? Authorizing Provider  citalopram (CELEXA) 20 MG tablet TAKE 1 TABLET (20 MG TOTAL) BY MOUTH DAILY. 07/07/17  Yes Nathan MilanBerglund, Laura H, MD  meloxicam (MOBIC) 15 MG tablet Take 1 tablet (15 mg total) by mouth daily as needed. 07/30/17  Yes Cook, Dorie RankJayce G, DO      VITAL SIGNS:  Blood pressure Marland Kitchen(!)  151/106, pulse 68, temperature 98 F (36.7 C), temperature source Oral, resp. rate 16, height 5\' 11"  (1.803 m), weight 80.7 kg (178 lb), SpO2 98 %.  PHYSICAL EXAMINATION:  GENERAL:  47 y.o.-year-old patient lying in the bed with no acute distress.  EYES: Pupils equal, round, reactive to light and accommodation. No scleral icterus. Extraocular muscles intact.  HEENT: Head atraumatic, normocephalic. Oropharynx and nasopharynx clear.  NECK:  Supple, no jugular venous distention. No thyroid enlargement, no  tenderness.  LUNGS: Normal breath sounds bilaterally, no wheezing, rales,rhonchi or crepitation. No use of accessory muscles of respiration.  CARDIOVASCULAR: S1, S2 normal. No murmurs, rubs, or gallops.  ABDOMEN: Soft, nontender, nondistended. Bowel sounds present. No organomegaly or mass.  EXTREMITIES: No pedal edema, cyanosis, or clubbing.  NEUROLOGIC: Cranial nerves II through XII are intact. Muscle strength 5/5 in all extremities. Sensation intact. Gait not checked.  PSYCHIATRIC: The patient is alert and oriented x 3.  SKIN: No obvious rash, lesion, or ulcer.   LABORATORY PANEL:   CBC Recent Labs  Lab 08/19/17 0949  WBC 7.1  HGB 15.2  HCT 43.9  PLT 222   ------------------------------------------------------------------------------------------------------------------  Chemistries  Recent Labs  Lab 08/19/17 0949  NA 139  K 3.7  CL 104  CO2 25  GLUCOSE 82  BUN 14  CREATININE 0.88  CALCIUM 9.1   ------------------------------------------------------------------------------------------------------------------  Cardiac Enzymes Recent Labs  Lab 08/19/17 0949  TROPONINI 1.18*   ------------------------------------------------------------------------------------------------------------------  RADIOLOGY:  Dg Chest 2 View  Result Date: 08/19/2017 CLINICAL DATA:  Chest pain over the last few days. EXAM: CHEST - 2 VIEW COMPARISON:  07/02/2006 FINDINGS: Heart size is normal. Mediastinal shadows are normal. The lungs are clear. No bronchial thickening. No infiltrate, mass, effusion or collapse. Pulmonary vascularity is normal. No bony abnormality. IMPRESSION: Normal chest Electronically Signed   By: Paulina Fusi M.D.   On: 08/19/2017 10:24    EKG:   Normal sinus rhythm. ST-T changes in inferior lateral leads IMPRESSION AND PLAN:   [name]  is a 47 y.o. male with a known history of anxiety, tobacco abuse, hypertension not on any medications comes to the emergency  room with chest pain started off this morning. Patient came to the emergency room found to have STT changes in inferior lateral leads. His troponin was 1.18. He's being admitted with acute NST EMI  1.Acute NSTEMI -admitted patient to telemetry floor -cardiology consultation -continue aspirin, Nitro paste, heparin drip, beta blockers, statins -cardiology plans for cardiac catheterization this afternoon  2. Malignant/uncontrolled hypertension -continue beta-blockers, and ace inhibitors if required  3. Anxiety continues citalopram  4. Tobacco abuse smoking cessation counseling done more than four minutes spent     All the records are reviewed and case discussed with ED provider. Management plans discussed with the patient, family and they are in agreement.  CODE STATUS: full  TOTAL critical TIME TAKING CARE OF THIS PATIENT: *55* minutes.    Enedina Finner M.D on 08/19/2017 at 3:30 PM  Between 7am to 6pm - Pager - [phone]  After 6pm go to [email or web address] - password [email or web address] [facility]  [facility]  Office  [phone]  CC: Primary care physician; Nathan Milan, MD

## 2017-08-20 ENCOUNTER — Other Ambulatory Visit: Payer: Self-pay

## 2017-08-20 ENCOUNTER — Encounter: Payer: Self-pay | Admitting: Internal Medicine

## 2017-08-20 ENCOUNTER — Inpatient Hospital Stay (HOSPITAL_COMMUNITY)
Admit: 2017-08-20 | Discharge: 2017-08-20 | Disposition: A | Discharge status: Home / Self Care | Payer: 59 | Attending: Internal Medicine | Admitting: Internal Medicine

## 2017-08-20 DIAGNOSIS — I214 Non-ST elevation (NSTEMI) myocardial infarction: Secondary | ICD-10-CM

## 2017-08-20 LAB — TROPONIN I
Troponin I: 2 ng/mL (ref <0.03)
Troponin I: 2.31 ng/mL (ref <0.03)
Troponin I: 1.73 ng/mL (ref <0.03)

## 2017-08-20 LAB — CBC
HCT: 40.9 % (ref 40.0–52.0)
Hemoglobin: 14 g/dL (ref 13.0–18.0)
MCH: 32.4 pg (ref 26.0–34.0)
MCHC: 34.4 g/dL (ref 32.0–36.0)
MCV: 94.4 fL (ref 80.0–100.0)
PLATELETS: 208 10*3/uL (ref 150–440)
RBC: 4.33 MIL/uL — ABNORMAL LOW (ref 4.40–5.90)
RDW: 12.9 % (ref 11.5–14.5)
WBC: 7.9 10*3/uL (ref 3.8–10.6)

## 2017-08-20 LAB — BASIC METABOLIC PANEL
Anion gap: 9 (ref 5–15)
BUN: 17 mg/dL (ref 6–20)
CO2: 27 mmol/L (ref 22–32)
Calcium: 8.9 mg/dL (ref 8.9–10.3)
Chloride: 105 mmol/L (ref 101–111)
Creatinine, Ser: 0.88 mg/dL (ref 0.61–1.24)
GFR calc Af Amer: >60 mL/min (ref >60)
GLUCOSE: 89 mg/dL (ref 65–99)
Potassium: 3.8 mmol/L (ref 3.5–5.1)
SODIUM: 141 mmol/L (ref 135–145)

## 2017-08-20 LAB — ECHOCARDIOGRAM COMPLETE
HEIGHTINCHES: 71 in
Weight: 2857.6 oz

## 2017-08-20 MED ORDER — ENSURE ENLIVE PO LIQD: 237.0000 mL | ORAL | Status: DC

## 2017-08-20 MED ORDER — LOSARTAN POTASSIUM 25 MG PO TABS
25.0000 mg | ORAL_TABLET | Freq: Every day | ORAL | Status: DC
  Administered 2017-08-20: 25 mg via ORAL
  Filled 2017-08-20: qty 1

## 2017-08-20 NOTE — Plan of Care (Signed)
  Problem: Clinical Measurements: Goal: Will remain free from infection Outcome: Progressing Note:  Remains afebrile Goal: Diagnostic test results will improve Outcome: Progressing Note:  K 3.8 troponin trending down   Problem: Pain Managment: Goal: General experience of comfort will improve Outcome: Progressing Note:  No complaints of CP today   Problem: Cardiac: Goal: Ability to achieve and maintain adequate cardiovascular perfusion will improve Outcome: Progressing Note:  Stent x2 placed yesterday, troponin trending down, dietary consult for heart healthy options ordered

## 2017-08-20 NOTE — Progress Notes (Signed)
*  PRELIMINARY RESULTS* Echocardiogram 2D Echocardiogram has been performed.  Nathan GulaJoan M Almeter Gray 08/20/2017, 9:28 AM

## 2017-08-20 NOTE — Care Management (Signed)
Patient to discharge on Brilinta.  Patient verbally confirmed pharmacy coverage with insurance.  Provided with coupon for copay assist. 

## 2017-08-20 NOTE — Plan of Care (Signed)
  Problem: Education: Goal: Knowledge of General Education information will improve Outcome: Progressing   Problem: Health Behavior/Discharge Planning: Goal: Ability to manage health-related needs will improve Outcome: Progressing   Problem: Clinical Measurements: Goal: Ability to maintain clinical measurements within normal limits will improve Outcome: Progressing Goal: Will remain free from infection Outcome: Progressing Goal: Diagnostic test results will improve Outcome: Progressing Goal: Respiratory complications will improve Outcome: Progressing   Problem: Coping: Goal: Level of anxiety will decrease Outcome: Progressing

## 2017-08-20 NOTE — Progress Notes (Addendum)
Progress Note  Patient Name: Bonner Punaatrick K Hollings Date of Encounter: 08/20/2017  Primary Cardiologist: Yvonne Kendallhristopher End, MD  Subjective   Episodes of mild c/p - 2/10 - last night.  Lasted several hours. Seen by Dr. Okey DupreEnd @ that time.  After c/p resolved, he slept well.  No recurrence this AM.  Denies dyspnea.  R Wrist feels good.  Inpatient Medications    Scheduled Meds: . aspirin  81 mg Oral Daily  . atorvastatin  80 mg Oral q1800  . carvedilol  6.25 mg Oral BID WC  . citalopram  20 mg Oral Daily  . enoxaparin (LOVENOX) injection  40 mg Subcutaneous Q24H  . sodium chloride flush  3 mL Intravenous Q12H  . ticagrelor  90 mg Oral BID   Continuous Infusions: . sodium chloride     PRN Meds: sodium chloride, acetaminophen **OR** acetaminophen, labetalol, nitroGLYCERIN, ondansetron **OR** ondansetron (ZOFRAN) IV, sodium chloride flush, traMADol   Vital Signs    Vitals:   08/19/17 2017 08/20/17 0300 08/20/17 0443 08/20/17 0747  BP: (!) 158/106 (!) 156/105 (!) 151/93 (!) 145/88  Pulse: 75 80  63  Resp: 20 20  15   Temp: 97.6 F (36.4 C) 97.6 F (36.4 C)  97.7 F (36.5 C)  TempSrc: Oral Oral  Oral  SpO2: 99% 100%  99%  Weight:   178 lb 9.6 oz (81 kg)   Height:        Intake/Output Summary (Last 24 hours) at 08/20/2017 0754 Last data filed at 08/19/2017 2000 Gross per 24 hour  Intake -  Output 750 ml  Net -750 ml   Filed Weights   08/19/17 0945 08/19/17 1440 08/20/17 0443  Weight: 178 lb (80.7 kg) 178 lb (80.7 kg) 178 lb 9.6 oz (81 kg)    Physical Exam   GEN: Well nourished, well developed, in no acute distress.  HEENT: Grossly normal.  Neck: Supple, no JVD, carotid bruits, or masses. Cardiac: RRR, no murmurs, rubs, or gallops. No clubbing, cyanosis, edema.  Radials/DP/PT 2+ and equal bilaterally.  R wrist w/o bleeding/bruit/hematoma. Respiratory:  Respirations regular and unlabored, clear to auscultation bilaterally. GI: Soft, nontender, nondistended, BS + x 4. MS: no  deformity or atrophy. Skin: warm and dry, no rash. Neuro:  Strength and sensation are intact. Psych: AAOx3.  Normal affect.  Labs    Chemistry Recent Labs  Lab 08/19/17 0949 08/20/17 0343  NA 139 141  K 3.7 3.8  CL 104 105  CO2 25 27  GLUCOSE 82 89  BUN 14 17  CREATININE 0.88 0.88  CALCIUM 9.1 8.9  GFRNONAA >60 >60  GFRAA >60 >60  ANIONGAP 10 9     Hematology Recent Labs  Lab 08/19/17 0949 08/20/17 0343  WBC 7.1 7.9  RBC 4.70 4.33*  HGB 15.2 14.0  HCT 43.9 40.9  MCV 93.4 94.4  MCH 32.3 32.4  MCHC 34.5 34.4  RDW 12.7 12.9  PLT 222 208    Cardiac Enzymes Recent Labs  Lab 08/19/17 0949 08/19/17 2044 08/19/17 2345 08/20/17 0343  TROPONINI 1.18* 2.06* 2.00* 2.31*     Radiology    Dg Chest 2 View  Result Date: 08/19/2017 CLINICAL DATA:  Chest pain over the last few days. EXAM: CHEST - 2 VIEW COMPARISON:  07/02/2006 FINDINGS: Heart size is normal. Mediastinal shadows are normal. The lungs are clear. No bronchial thickening. No infiltrate, mass, effusion or collapse. Pulmonary vascularity is normal. No bony abnormality. IMPRESSION: Normal chest Electronically Signed   By: Loraine LericheMark  Shogry M.D.   On: 08/19/2017 10:24    Telemetry    RSR - Personally Reviewed  Cardiac Studies   Cardiac Catheterization 4.3.2019  Left Main  Vessel is large. Vessel is angiographically normal.  Left Anterior Descending  Vessel is large.  Ost LAD lesion 30% stenosed  Ost LAD lesion is 30% stenosed. The lesion is eccentric. The lesion is mildly calcified.  Mid LAD lesion 10% stenosed  Mid LAD lesion is 10% stenosed.  First Diagonal Branch  Vessel is large in size.  Ost 1st Diag lesion 50% stenosed  Ost 1st Diag lesion is 50% stenosed.  Ramus Intermedius  Vessel is moderate in size. Vessel is angiographically normal.  Left Circumflex  Vessel is small. Vessel is angiographically normal.  Right Coronary Artery  Vessel is moderate in size.  Prox RCA lesion 70% stenosed    Prox RCA lesion is 70% stenosed.  Mid RCA lesion 95% stenosed  Mid RCA lesion is 95% stenosed.  **RCA successfully stented w/ 3.0x28 [place] DES Left Ventricle The left ventricular size is normal. The left ventricular systolic function is normal. LV end diastolic pressure is mildly elevated. The left ventricular ejection fraction is greater than 65% by visual estimate. No regional wall motion abnormalities. There is no evidence of mitral regurgitation.    Patient Profile     47 y.o. male w/ h/o anxiety, tob abuse, and untreated HTN, admitted 4/3 with NSTEMI, now s/p PCI/DES to the RCA.  Assessment & Plan    1. NSTEMI/CAD:  S/p PCI/DES to the prox/mid RCA on 4/3.  Had mild c/p post-PCI.  Bedside echo did not show any new wma's.  Trop trend relatively flat.  No recurrent c/p this AM.  Cont asa, statin,  blocker, and brilinta.  Will add ARB this AM given ongoing HTN.  Cardiac rehab to see.  Ambulate.  Likely home in AM.  2.  HTN:  Previously untreated.  Tolerating  blocker but required prn hydral and labetalol last night.  145/88 this AM.  Will add losartan 25 daily and titrate as necessary.  HR's hovering right around 60, thus won't likely be able to titrate  blocker further.  3.  LIpids:  Pending this AM.  Cont high potency statin Rx in the setting of #1.  Plan to f/u lipids/lft's in 6-8 wks as outpt.  4.  Tob Abuse:  Complete cessation advised.  Will need ongoing encouragement and counseling.  5.  Anxiety:  Stable on home dose of celexa.  Signed, Nicolasa Ducking, NP  08/20/2017, 7:54 AM    For questions or updates, please contact   Please consult [email or web address] for contact info under Cardiology/STEMI.

## 2017-08-20 NOTE — Progress Notes (Signed)
Nutrition Education Note  RD consulted for nutrition education regarding a Heart Healthy diet.   47 y.o. male with a known history of anxiety, tobacco abuse, hypertension not on any medications admitted for STEMI  RD provided "Heart Healthy Nutrition Therapy" handout from the Academy of [employer]. Reviewed patient's dietary recall. Provided examples on ways to decrease sodium and fat intake in diet. Discouraged intake of processed foods and use of salt shaker. Encouraged fresh fruits and vegetables as well as whole grain sources of carbohydrates to maximize fiber intake. Teach back method used.  Expect good compliance.  Body mass index is 24.91 kg/m. Pt meets criteria for normal weight based on current BMI.  Current diet order is HH, patient is consuming approximately 100% of meals at this time. Labs and medications reviewed. No further nutrition interventions warranted at this time. RD contact information provided. If additional nutrition issues arise, please re-consult RD.  Betsey Holidayasey Shaniya Tashiro MS, RD, LDN Pager #- [phone] After Hours Pager: [phone]

## 2017-08-20 NOTE — Progress Notes (Signed)
Ambulated pt in hallway, denies chest pain or shortness of breath.

## 2017-08-20 NOTE — Progress Notes (Signed)
Pt arrived via stretcher from specials with family at side. Pt A&O with no complaints of pain at this time. Telemetry monitor applied and called to CCMD. Oriented to room, booklet given. Skin and cath site assessed.

## 2017-08-21 ENCOUNTER — Telehealth: Payer: Self-pay | Admitting: Internal Medicine

## 2017-08-21 ENCOUNTER — Telehealth: Payer: Self-pay | Admitting: *Deleted

## 2017-08-21 LAB — COMPREHENSIVE METABOLIC PANEL
ALBUMIN: 3.7 g/dL (ref 3.5–5.0)
ALK PHOS: 52 U/L (ref 38–126)
ALT: 33 U/L (ref 17–63)
ANION GAP: 6 (ref 5–15)
AST: 41 U/L (ref 15–41)
BUN: 24 mg/dL — ABNORMAL HIGH (ref 6–20)
CALCIUM: 8.9 mg/dL (ref 8.9–10.3)
CHLORIDE: 105 mmol/L (ref 101–111)
CO2: 28 mmol/L (ref 22–32)
CREATININE: 0.79 mg/dL (ref 0.61–1.24)
GFR calc non Af Amer: >60 mL/min (ref >60)
GLUCOSE: 102 mg/dL — AB (ref 65–99)
Potassium: 3.8 mmol/L (ref 3.5–5.1)
SODIUM: 139 mmol/L (ref 135–145)
Total Bilirubin: 0.6 mg/dL (ref 0.3–1.2)
Total Protein: 6.6 g/dL (ref 6.5–8.1)

## 2017-08-21 LAB — LIPID PANEL
Cholesterol: 192 mg/dL (ref 0–200)
HDL: 47 mg/dL (ref >40)
LDL CALC: 94 mg/dL (ref 0–99)
Total CHOL/HDL Ratio: 4.1 RATIO
Triglycerides: 254 mg/dL — ABNORMAL HIGH (ref <150)
VLDL: 51 mg/dL — ABNORMAL HIGH (ref 0–40)

## 2017-08-21 MED ORDER — LOSARTAN POTASSIUM 50 MG PO TABS: 50.0000 mg | ORAL_TABLET | Freq: Every day | ORAL | 2 refills | Status: DC

## 2017-08-21 MED ORDER — NITROGLYCERIN 0.4 MG SL SUBL: 0.4000 mg | SUBLINGUAL_TABLET | SUBLINGUAL | 2 refills | Status: DC | PRN

## 2017-08-21 MED ORDER — TICAGRELOR 90 MG PO TABS: 90.0000 mg | ORAL_TABLET | Freq: Two times a day (BID) | ORAL | 2 refills | Status: DC

## 2017-08-21 MED ORDER — ATORVASTATIN CALCIUM 80 MG PO TABS: 80.0000 mg | ORAL_TABLET | Freq: Every day | ORAL | 2 refills | Status: DC

## 2017-08-21 MED ORDER — CARVEDILOL 6.25 MG PO TABS: 6.2500 mg | ORAL_TABLET | Freq: Two times a day (BID) | ORAL | 2 refills | Status: DC

## 2017-08-21 MED ORDER — ASPIRIN 81 MG PO CHEW: 81.0000 mg | CHEWABLE_TABLET | Freq: Every day | ORAL | 2 refills | Status: DC

## 2017-08-21 MED ORDER — LOSARTAN POTASSIUM 50 MG PO TABS
50.0000 mg | ORAL_TABLET | Freq: Every day | ORAL | Status: DC
  Administered 2017-08-21: 50 mg via ORAL
  Filled 2017-08-21: qty 1

## 2017-08-21 NOTE — Discharge Instructions (Signed)
Nitroglycerin sublingual tablets What is this medicine? NITROGLYCERIN (nye troe GLI ser in) is a type of vasodilator. It relaxes blood vessels, increasing the blood and oxygen supply to your heart. This medicine is used to relieve chest pain caused by angina. It is also used to prevent chest pain before activities like climbing stairs, going outdoors in cold weather, or sexual activity. This medicine may be used for other purposes; ask your health care provider or pharmacist if you have questions. COMMON BRAND NAME(S): Nitroquick, Nitrostat, Nitrotab What should I tell my health care provider before I take this medicine? They need to know if you have any of these conditions: -anemia -head injury, recent stroke, or bleeding in the brain -liver disease -previous heart attack -an unusual or allergic reaction to nitroglycerin, other medicines, foods, dyes, or preservatives -pregnant or trying to get pregnant -breast-feeding How should I use this medicine? Take this medicine by mouth as needed. At the first sign of an angina attack (chest pain or tightness) place one tablet under your tongue. You can also take this medicine 5 to 10 minutes before an event likely to produce chest pain. Follow the directions on the prescription label. Let the tablet dissolve under the tongue. Do not swallow whole. Replace the dose if you accidentally swallow it. It will help if your mouth is not dry. Saliva around the tablet will help it to dissolve more quickly. Do not eat or drink, smoke or chew tobacco while a tablet is dissolving. If you are not better within 5 minutes after taking ONE dose of nitroglycerin, call 9-1-1 immediately to seek emergency medical care. Do not take more than 3 nitroglycerin tablets over 15 minutes. If you take this medicine often to relieve symptoms of angina, your doctor or health care professional may provide you with different instructions to manage your symptoms. If symptoms do not go away  after following these instructions, it is important to call 9-1-1 immediately. Do not take more than 3 nitroglycerin tablets over 15 minutes. Talk to your pediatrician regarding the use of this medicine in children. Special care may be needed. Overdosage: If you think you have taken too much of this medicine contact a poison control center or emergency room at once. NOTE: This medicine is only for you. Do not share this medicine with others. What if I miss a dose? This does not apply. This medicine is only used as needed. What may interact with this medicine? Do not take this medicine with any of the following medications: -certain migraine medicines like ergotamine and dihydroergotamine (DHE) -medicines used to treat erectile dysfunction like sildenafil, tadalafil, and vardenafil -riociguat This medicine may also interact with the following medications: -alteplase -aspirin -heparin -medicines for high blood pressure -medicines for mental depression -other medicines used to treat angina -phenothiazines like chlorpromazine, mesoridazine, prochlorperazine, thioridazine This list may not describe all possible interactions. Give your health care provider a list of all the medicines, herbs, non-prescription drugs, or dietary supplements you use. Also tell them if you smoke, drink alcohol, or use illegal drugs. Some items may interact with your medicine. What should I watch for while using this medicine? Tell your doctor or health care professional if you feel your medicine is no longer working. Keep this medicine with you at all times. Sit or lie down when you take your medicine to prevent falling if you feel dizzy or faint after using it. Try to remain calm. This will help you to feel better faster. If you feel  dizzy, take several deep breaths and lie down with your feet propped up, or bend forward with your head resting between your knees. You may get drowsy or dizzy. Do not drive, use machinery,  or do anything that needs mental alertness until you know how this drug affects you. Do not stand or sit up quickly, especially if you are an older patient. This reduces the risk of dizzy or fainting spells. Alcohol can make you more drowsy and dizzy. Avoid alcoholic drinks. Do not treat yourself for coughs, colds, or pain while you are taking this medicine without asking your doctor or health care professional for advice. Some ingredients may increase your blood pressure. What side effects may I notice from receiving this medicine? Side effects that you should report to your doctor or health care professional as soon as possible: -blurred vision -dry mouth -skin rash -sweating -the feeling of extreme pressure in the head -unusually weak or tired Side effects that usually do not require medical attention (report to your doctor or health care professional if they continue or are bothersome): -flushing of the face or neck -headache -irregular heartbeat, palpitations -nausea, vomiting This list may not describe all possible side effects. Call your doctor for medical advice about side effects. You may report side effects to FDA at 1-800-FDA-1088. Where should I keep my medicine? Keep out of the reach of children. Store at room temperature between 20 and 25 degrees C (68 and 77 degrees F). Store in Retail [employer]. Protect from light and moisture. Keep tightly closed. Throw away any unused medicine after the expiration date. NOTE: This sheet is a summary. It may not cover all possible information. If you have questions about this medicine, talk to your doctor, pharmacist, or health care provider.  2018 Elsevier/Gold Standard (2013-03-03 17:57:36) Losartan tablets What is this medicine? LOSARTAN (loe SAR tan) is used to treat high blood pressure and to reduce the risk of stroke in certain patients. This drug also slows the progression of kidney disease in patients with diabetes. This medicine  may be used for other purposes; ask your health care provider or pharmacist if you have questions. COMMON BRAND NAME(S): Cozaar What should I tell my health care provider before I take this medicine? They need to know if you have any of these conditions: -heart failure -kidney or liver disease -an unusual or allergic reaction to losartan, other medicines, foods, dyes, or preservatives -pregnant or trying to get pregnant -breast-feeding How should I use this medicine? Take this medicine by mouth with a glass of water. Follow the directions on the prescription label. This medicine can be taken with or without food. Take your doses at regular intervals. Do not take your medicine more often than directed. Talk to your pediatrician regarding the use of this medicine in children. Special care may be needed. Overdosage: If you think you have taken too much of this medicine contact a poison control center or emergency room at once. NOTE: This medicine is only for you. Do not share this medicine with others. What if I miss a dose? If you miss a dose, take it as soon as you can. If it is almost time for your next dose, take only that dose. Do not take double or extra doses. What may interact with this medicine? -blood pressure medicines -diuretics, especially triamterene, spironolactone, or amiloride -fluconazole -NSAIDs, medicines for pain and inflammation, like ibuprofen or naproxen -potassium salts or potassium supplements -rifampin This list may not describe all possible interactions.  Give your health care provider a list of all the medicines, herbs, non-prescription drugs, or dietary supplements you use. Also tell them if you smoke, drink alcohol, or use illegal drugs. Some items may interact with your medicine. What should I watch for while using this medicine? Visit your doctor or health care professional for regular checks on your progress. Check your blood pressure as directed. Ask your  doctor or health care professional what your blood pressure should be and when you should contact him or her. Call your doctor or health care professional if you notice an irregular or fast heart beat. Women should inform their doctor if they wish to become pregnant or think they might be pregnant. There is a potential for serious side effects to an unborn child, particularly in the second or third trimester. Talk to your health care professional or pharmacist for more information. You may get drowsy or dizzy. Do not drive, use machinery, or do anything that needs mental alertness until you know how this drug affects you. Do not stand or sit up quickly, especially if you are an older patient. This reduces the risk of dizzy or fainting spells. Alcohol can make you more drowsy and dizzy. Avoid alcoholic drinks. Avoid salt substitutes unless you are told otherwise by your doctor or health care professional. Do not treat yourself for coughs, colds, or pain while you are taking this medicine without asking your doctor or health care professional for advice. Some ingredients may increase your blood pressure. What side effects may I notice from receiving this medicine? Side effects that you should report to your doctor or health care professional as soon as possible: -confusion, dizziness, light headedness or fainting spells -decreased amount of urine passed -difficulty breathing or swallowing, hoarseness, or tightening of the throat -fast or irregular heart beat, palpitations, or chest pain -skin rash, itching -swelling of your face, lips, tongue, hands, or feet Side effects that usually do not require medical attention (report to your doctor or health care professional if they continue or are bothersome): -cough -decreased sexual function or desire -headache -nasal congestion or stuffiness -nausea or stomach pain -sore or cramping muscles This list may not describe all possible side effects. Call your  doctor for medical advice about side effects. You may report side effects to FDA at 1-800-FDA-1088. Where should I keep my medicine? Keep out of the reach of children. Store at room temperature between 15 and 30 degrees C (59 and 86 degrees F). Protect from light. Keep container tightly closed. Throw away any unused medicine after the expiration date. NOTE: This sheet is a summary. It may not cover all possible information. If you have questions about this medicine, talk to your doctor, pharmacist, or health care provider.  2018 Elsevier/Gold Standard (2007-07-16 16:42:18) Atorvastatin tablets What is this medicine? ATORVASTATIN (a TORE va sta tin) is known as a HMG-CoA reductase inhibitor or 'statin'. It lowers the level of cholesterol and triglycerides in the blood. This drug may also reduce the risk of heart attack, stroke, or other health problems in patients with risk factors for heart disease. Diet and lifestyle changes are often used with this drug. This medicine may be used for other purposes; ask your health care provider or pharmacist if you have questions. COMMON BRAND NAME(S): Lipitor What should I tell my health care provider before I take this medicine? They need to know if you have any of these conditions: -frequently drink alcoholic beverages -history of stroke, TIA -kidney disease -liver  disease -muscle aches or weakness -other medical condition -an unusual or allergic reaction to atorvastatin, other medicines, foods, dyes, or preservatives -pregnant or trying to get pregnant -breast-feeding How should I use this medicine? Take this medicine by mouth with a glass of water. Follow the directions on the prescription label. You can take this medicine with or without food. Take your doses at regular intervals. Do not take your medicine more often than directed. Talk to your pediatrician regarding the use of this medicine in children. While this drug may be prescribed for  children as young as 57 years old for selected conditions, precautions do apply. Overdosage: If you think you have taken too much of this medicine contact a poison control center or emergency room at once. NOTE: This medicine is only for you. Do not share this medicine with others. What if I miss a dose? If you miss a dose, take it as soon as you can. If it is almost time for your next dose, take only that dose. Do not take double or extra doses. What may interact with this medicine? Do not take this medicine with any of the following medications: -red yeast rice -telaprevir -telithromycin -voriconazole This medicine may also interact with the following medications: -alcohol -antiviral medicines for HIV or AIDS -boceprevir -certain antibiotics like clarithromycin, erythromycin, troleandomycin -certain medicines for cholesterol like fenofibrate or gemfibrozil -cimetidine -clarithromycin -colchicine -cyclosporine -digoxin -male hormones, like estrogens or progestins and birth control pills -grapefruit juice -medicines for fungal infections like fluconazole, itraconazole, ketoconazole -niacin -rifampin -spironolactone This list may not describe all possible interactions. Give your health care provider a list of all the medicines, herbs, non-prescription drugs, or dietary supplements you use. Also tell them if you smoke, drink alcohol, or use illegal drugs. Some items may interact with your medicine. What should I watch for while using this medicine? Visit your doctor or health care professional for regular check-ups. You may need regular tests to make sure your liver is working properly. Tell your doctor or health care professional right away if you get any unexplained muscle pain, tenderness, or weakness, especially if you also have a fever and tiredness. Your doctor or health care professional may tell you to stop taking this medicine if you develop muscle problems. If your muscle  problems do not go away after stopping this medicine, contact your health care professional. This drug is only part of a total heart-health program. Your doctor or a dietician can suggest a low-cholesterol and low-fat diet to help. Avoid alcohol and smoking, and keep a proper exercise schedule. Do not use this drug if you are pregnant or breast-feeding. Serious side effects to an unborn child or to an infant are possible. Talk to your doctor or pharmacist for more information. This medicine may affect blood sugar levels. If you have diabetes, check with your doctor or health care professional before you change your diet or the dose of your diabetic medicine. If you are going to have surgery tell your health care professional that you are taking this drug. What side effects may I notice from receiving this medicine? Side effects that you should report to your doctor or health care professional as soon as possible: -allergic reactions like skin rash, itching or hives, swelling of the face, lips, or tongue -dark urine -fever -joint pain -muscle cramps, pain -redness, blistering, peeling or loosening of the skin, including inside the mouth -trouble passing urine or change in the amount of urine -unusually weak or tired -yellowing  of eyes or skin Side effects that usually do not require medical attention (report to your doctor or health care professional if they continue or are bothersome): -constipation -heartburn -stomach gas, pain, upset This list may not describe all possible side effects. Call your doctor for medical advice about side effects. You may report side effects to FDA at 1-800-FDA-1088. Where should I keep my medicine? Keep out of the reach of children. Store at room temperature between 20 to 25 degrees C (68 to 77 degrees F). Throw away any unused medicine after the expiration date. NOTE: This sheet is a summary. It may not cover all possible information. If you have questions  about this medicine, talk to your doctor, pharmacist, or health care provider.  2018 Elsevier/Gold Standard (2011-03-25 16:10:96) Aspirin and Your Heart Aspirin is a medicine that affects the way blood clots. Aspirin can be used to help reduce the risk of blood clots, heart attacks, and other heart-related problems. Should I take aspirin? Your health care provider will help you determine whether it is safe and beneficial for you to take aspirin daily. Taking aspirin daily may be beneficial if you:  Have had a heart attack or chest pain.  Have undergone open heart surgery such as coronary artery bypass surgery (CABG).  Have had coronary angioplasty.  Have experienced a stroke or transient ischemic attack (TIA).  Have peripheral vascular disease (PVD).  Have chronic heart rhythm problems such as atrial fibrillation.  Are there any risks of taking aspirin daily? Daily use of aspirin can increase your risk of side effects. Some of these include:  Bleeding. Bleeding problems can be minor or serious. An example of a minor problem is a cut that does not stop bleeding. An example of a more serious problem is stomach bleeding or bleeding into the brain. Your risk of bleeding is increased if you are also taking non-steroidal anti-inflammatory medicine (NSAIDs).  Increased bruising.  Upset stomach.  An allergic reaction. People who have nasal polyps have an increased risk of developing an aspirin allergy.  What are some guidelines I should follow when taking aspirin?  Take aspirin only as directed by your health care provider. Make sure you understand how much you should take and what form you should take. The two forms of aspirin are: ? Non-enteric-coated. This type of aspirin does not have a coating and is absorbed quickly. Non-enteric-coated aspirin is usually recommended for people with chest pain. This type of aspirin also comes in a chewable form. ? Enteric-coated. This type of aspirin  has a special coating that releases the medicine very slowly. Enteric-coated aspirin causes less stomach upset than non-enteric-coated aspirin. This type of aspirin should not be chewed or crushed.  Drink alcohol in moderation. Drinking alcohol increases your risk of bleeding. When should I seek medical care?  You have unusual bleeding or bruising.  You have stomach pain.  You have an allergic reaction. Symptoms of an allergic reaction include: ? Hives. ? Itchy skin. ? Swelling of the lips, tongue, or face.  You have ringing in your ears. When should I seek immediate medical care?  Your bowel movements are bloody, dark red, or black in color.  You vomit or cough up blood.  You have blood in your urine.  You cough, wheeze, or feel short of breath. If you have any of the following symptoms, this is an emergency. Do not wait to see if the pain will go away. Get medical help at once. Call your local  emergency services (911 in the [place] Do not drive yourself to the hospital.  You have severe chest pain, especially if the pain is crushing or pressure-like and spreads to the arms, back, neck, or jaw.  You have stroke-like symptoms, such as: ? Loss of vision. ? Difficulty talking. ? Numbness or weakness on one side of your body. ? Numbness or weakness in your arm or leg. ? Not thinking clearly or feeling confused.  This information is not intended to replace advice given to you by your health care provider. Make sure you discuss any questions you have with your health care provider. Document Released: 04/17/2008 Document Revised: 09/12/2015 Document Reviewed: 08/10/2013 Elsevier Interactive Patient Education  2018 [employer]. Carvedilol oral capsule, extended release What is this medicine? CARVEDILOL (KAR ve dil ol) is a beta-blocker. Beta-blockers reduce the workload on the heart and help it beat more regularly. This medicine is used to treat high blood pressure, heart failure,  and heart problems after a heart attack. This medicine may be used for other purposes; ask your health care provider or pharmacist if you have questions. COMMON BRAND NAME(S): Coreg CR What should I tell my health care provider before I take this medicine? They need to know if you have any of these conditions: -circulation problems -diabetes -history of heart attack or heart disease -liver disease -lung or breathing disease, like asthma or emphysema -pheochromocytoma -slow or irregular heartbeat -thyroid disease -an unusual or allergic reaction to carvedilol, other beta-blockers, medicines, foods, dyes, or preservatives -pregnant or trying to get pregnant -breast-feeding How should I use this medicine? Take this medicine by mouth with a glass of water. Follow the directions on the prescription label. Take with food. Do not cut, crush or chew this medicine. Take your doses at regular intervals. Do not take your medicine more often than directed. Do not stop taking except on your doctor's advice. Talk to your pediatrician regarding the use of this medicine in children. Special care may be needed. Overdosage: If you think you have taken too much of this medicine contact a poison control center or emergency room at once. NOTE: This medicine is only for you. Do not share this medicine with others. What if I miss a dose? If you miss a dose, take it as soon as you can. If it is almost time for your next dose, take only that dose. Do not take double or extra doses. What may interact with this medicine? This medicine may interact with the following medications: -certain medicines for blood pressure, heart disease, irregular heart beat -certain medicines for depression, like fluoxetine or paroxetine -certain medicines for diabetes, like glipizide or glyburide -cimetidine -clonidine -cyclosporine -digoxin -MAOIs like Carbex, Eldepryl, Marplan, Nardil, and Parnate -reserpine -rifampin This  list may not describe all possible interactions. Give your health care provider a list of all the medicines, herbs, non-prescription drugs, or dietary supplements you use. Also tell them if you smoke, drink alcohol, or use illegal drugs. Some items may interact with your medicine. What should I watch for while using this medicine? Check your heart rate and blood pressure regularly while you are taking this medicine. Ask your doctor or health care professional what your heart rate and blood pressure should be, and when you should contact him or her. Do not stop taking this medicine suddenly. This could lead to serious heart-related effects. Contact your doctor or health care professional if you have difficulty breathing while taking this drug. Check your weight daily. Ask  your doctor or health care professional when you should notify him/her of any weight gain. You may get drowsy or dizzy. Do not drive, use machinery, or do anything that requires mental alertness until you know how this medicine affects you. To reduce the risk of dizzy or fainting spells, do not sit or stand up quickly. Alcohol can make you more drowsy, and increase flushing and rapid heartbeats. Avoid alcoholic drinks. If you have diabetes, check your blood sugar as directed. Tell your doctor if you have changes in your blood sugar while you are taking this medicine. If you are going to have surgery, tell your doctor or health care professional that you are taking this medicine. What side effects may I notice from receiving this medicine? Side effects that you should report to your doctor or health care professional as soon as possible: -allergic reactions like skin rash, itching or hives, swelling of the face, lips, or tongue -breathing problems -dark urine -slow or irregular heartbeat -swollen legs or ankles -vomiting -yellowing of the eyes or skin Side effects that usually do not require medical attention (report to your doctor  or health care professional if they continue or are bothersome): -change in sex drive or performance -diarrhea -dry eyes (especially if wearing contact lenses) -dry, itching skin -headache -nausea -unusually tired This list may not describe all possible side effects. Call your doctor for medical advice about side effects. You may report side effects to FDA at 1-800-FDA-1088. Where should I keep my medicine? Keep out of the reach of children. Store at room temperature between 15 and 30 degrees C (59 and 86 degrees F). Protect from moisture. Keep container tightly closed. Throw away any unused medicine after the expiration date. NOTE: This sheet is a summary. It may not cover all possible information. If you have questions about this medicine, talk to your doctor, pharmacist, or health care provider.  2018 Elsevier/Gold Standard (2013-01-09 14:07:32) Ticagrelor oral tablet What is this medicine? TICAGRELOR (TYE ka GREL or) helps to prevent blood clots. This medicine is used to prevent heart attack, stroke, or other vascular events in people who have had a recent heart attack or who have severe chest pain. This medicine may be used for other purposes; ask your health care provider or pharmacist if you have questions. COMMON BRAND NAME(S): BRILINTA What should I tell my health care provider before I take this medicine? They need to know if you have any of these conditions: -bleeding disorders -bleeding in the brain -having surgery -history of irregular heartbeat -history of stomach bleeding -liver disease -an unusual or allergic reaction to ticagrelor, other medicines, foods, dyes, or preservatives -pregnant or trying to get pregnant -breast-feeding How should I use this medicine? Take this medicine by mouth with a glass of water. Follow the directions on the prescription label. You can take it with or without food. If it upsets your stomach, take it with food. Take your medicine at  regular intervals. Do not take it more often than directed. Do not stop taking except on your doctor's advice. Talk to you pediatrician regarding the use of this medicine in children. Special care may be needed. Overdosage: If you think you have taken too much of this medicine contact a poison control center or emergency room at once. NOTE: This medicine is only for you. Do not share this medicine with others. What if I miss a dose? If you miss a dose, take it as soon as you can. If it is almost  time for your next dose, take only that dose. Do not take double or extra doses. What may interact with this medicine? -certain antibiotics like clarithromycin and telithromycin -certain medicines for fungal infections like itraconazole, ketoconazole, and voriconazole -certain medicines for HIV infection like atazanavir, indinavir, nelfinavir, ritonavir, and saquinavir -certain medicines for seizures like carbamazepine, phenobarbital, and phenytoin -certain medicines that treat or prevent blood clots like warfarin -dexamethasone -digoxin -lovastatin -nefazodone -rifampin -simvastatin This list may not describe all possible interactions. Give your health care provider a list of all the medicines, herbs, non-prescription drugs, or dietary supplements you use. Also tell them if you smoke, drink alcohol, or use illegal drugs. Some items may interact with your medicine. What should I watch for while using this medicine? Visit your doctor or health care professional for regular check ups. Do not stop taking you medicine unless your doctor tells you to. Notify your doctor or health care professional and seek emergency treatment if you develop breathing problems; changes in vision; chest pain; severe, sudden headache; pain, swelling, warmth in the leg; trouble speaking; sudden numbness or weakness of the face, arm, or leg. These can be signs that your condition has gotten worse. If you are going to have surgery  or dental work, tell your doctor or health care professional that you are taking this medicine. You should take aspirin every day with this medicine. Do not take more than 100 mg each day. Talk to your doctor if you have questions. What side effects may I notice from receiving this medicine? Side effects that you should report to your doctor or health care professional as soon as possible: -allergic reactions like skin rash, itching or hives, swelling of the face, lips, or tongue -breathing problems -fast or irregular heartbeat -feeling faint or light-headed, falls -signs and symptoms of bleeding such as bloody or black, tarry stools; red or dark-brown urine; spitting up blood or brown material that looks like coffee grounds; red spots on the skin; unusual bruising or bleeding from the eye, gums, or nose Side effects that usually do not require medical attention (report to your doctor or health care professional if they continue or are bothersome): -breast enlargement in both males and females -diarrhea -dizziness -headache -tiredness -upset stomach This list may not describe all possible side effects. Call your doctor for medical advice about side effects. You may report side effects to FDA at 1-800-FDA-1088. Where should I keep my medicine? Keep out of the reach of children. Store at room temperature of 59 to 86 degrees F (15 to 30 degrees C). Throw away any unused medicine after the expiration date. NOTE: This sheet is a summary. It may not cover all possible information. If you have questions about this medicine, talk to your doctor, pharmacist, or health care provider.  2018 Elsevier/Gold Standard (2015-06-07 11:53:14) Radial Site Care Refer to this sheet in the next few weeks. These instructions provide you with information about caring for yourself after your procedure. Your health care provider may also give you more specific instructions. Your treatment has been planned according to  current medical practices, but problems sometimes occur. Call your health care provider if you have any problems or questions after your procedure. What can I expect after the procedure? After your procedure, it is typical to have the following:  Bruising at the radial site that usually fades within 1-2 weeks.  Blood collecting in the tissue (hematoma) that may be painful to the touch. It should usually decrease in size  and tenderness within 1-2 weeks.  Follow these instructions at home:  Take medicines only as directed by your health care provider.  You may shower 24-48 hours after the procedure or as directed by your health care provider. Remove the bandage (dressing) and gently wash the site with plain soap and water. Pat the area dry with a clean towel. Do not rub the site, because this may cause bleeding.  Do not take baths, swim, or use a hot tub until your health care provider approves.  Check your insertion site every day for redness, swelling, or drainage.  Do not apply powder or lotion to the site.  Do not flex or bend the affected arm for 24 hours or as directed by your health care provider.  Do not push or pull heavy objects with the affected arm for 24 hours or as directed by your health care provider.  Do not lift over 10 lb (4.5 kg) for 5 days after your procedure or as directed by your health care provider.  Ask your health care provider when it is okay to: ? Return to work or school. ? Resume usual physical activities or sports. ? Resume sexual activity.  Do not drive home if you are discharged the same day as the procedure. Have someone else drive you.  You may drive 24 hours after the procedure unless otherwise instructed by your health care provider.  Do not operate machinery or power tools for 24 hours after the procedure.  If your procedure was done as an outpatient procedure, which means that you went home the same day as your procedure, a responsible adult  should be with you for the first 24 hours after you arrive home.  Keep all follow-up visits as directed by your health care provider. This is important. Contact a health care provider if:  You have a fever.  You have chills.  You have increased bleeding from the radial site. Hold pressure on the site. Get help right away if:  You have unusual pain at the radial site.  You have redness, warmth, or swelling at the radial site.  You have drainage (other than a small amount of blood on the dressing) from the radial site.  The radial site is bleeding, and the bleeding does not stop after 30 minutes of holding steady pressure on the site.  Your arm or hand becomes pale, cool, tingly, or numb. This information is not intended to replace advice given to you by your health care provider. Make sure you discuss any questions you have with your health care provider. Document Released: 06/07/2010 Document Revised: 10/11/2015 Document Reviewed: 11/21/2013 Elsevier Interactive Patient Education  2018 [employer].

## 2017-08-21 NOTE — Progress Notes (Signed)
[facility] - [place] [facility]   PATIENT NAME: Yitzchak Kothari    MR#:  [id number]  DATE OF BIRTH:  09/18/1970  SUBJECTIVE:   Had some cp last nite. Doing ok now REVIEW OF SYSTEMS:   Review of Systems  Constitutional: Negative for chills, fever and weight loss.  HENT: Negative for ear discharge, ear pain and nosebleeds.   Eyes: Negative for blurred vision, pain and discharge.  Respiratory: Negative for sputum production, shortness of breath, wheezing and stridor.   Cardiovascular: Negative for chest pain, palpitations, orthopnea and PND.  Gastrointestinal: Negative for abdominal pain, diarrhea, nausea and vomiting.  Genitourinary: Negative for frequency and urgency.  Musculoskeletal: Negative for back pain and joint pain.  Neurological: Negative for sensory change, speech change, focal weakness and weakness.  Psychiatric/Behavioral: Negative for depression and hallucinations. The patient is not nervous/anxious.    Tolerating Diet:yes   DRUG ALLERGIES:   Allergies  Allergen Reactions  . Amoxicillin   . Penicillins     Has patient had a PCN reaction causing immediate rash, facial/tongue/throat swelling, SOB or lightheadedness with hypotension: Unknown Has patient had a PCN reaction causing severe rash involving mucus membranes or skin necrosis: Unknown Has patient had a PCN reaction that required hospitalization: Unknown Has patient had a PCN reaction occurring within the last 10 years: Unknown If all of the above answers are "NO", then may proceed with Cephalosporin use.    VITALS:  Blood pressure (!) 160/100, pulse 64, temperature 97.7 F (36.5 C), temperature source Oral, resp. rate 14, height 5\' 11"  (1.803 m), weight 81 kg (178 lb 9.6 oz), SpO2 100 %.  PHYSICAL EXAMINATION:   Physical Exam  GENERAL:  47 y.o.-year-old patient lying in the bed with no acute distress.  EYES: Pupils equal, round, reactive to light and accommodation. No  scleral icterus. Extraocular muscles intact.  HEENT: Head atraumatic, normocephalic. Oropharynx and nasopharynx clear.  NECK:  Supple, no jugular venous distention. No thyroid enlargement, no tenderness.  LUNGS: Normal breath sounds bilaterally, no wheezing, rales, rhonchi. No use of accessory muscles of respiration.  CARDIOVASCULAR: S1, S2 normal. No murmurs, rubs, or gallops.  ABDOMEN: Soft, nontender, nondistended. Bowel sounds present. No organomegaly or mass.  EXTREMITIES: No cyanosis, clubbing or edema b/l.    NEUROLOGIC: Cranial nerves II through XII are intact. No focal Motor or sensory deficits b/l.   PSYCHIATRIC:  patient is alert and oriented x 3.  SKIN: No obvious rash, lesion, or ulcer.   LABORATORY PANEL:  CBC Recent Labs  Lab 08/20/17 0343  WBC 7.9  HGB 14.0  HCT 40.9  PLT 208    Chemistries  Recent Labs  Lab 08/21/17 0058  NA 139  K 3.8  CL 105  CO2 28  GLUCOSE 102*  BUN 24*  CREATININE 0.79  CALCIUM 8.9  AST 41  ALT 33  ALKPHOS 52  BILITOT 0.6   Cardiac Enzymes Recent Labs  Lab 08/20/17 1030  TROPONINI 1.73*   RADIOLOGY:  No results found. ASSESSMENT AND PLAN:   PatrickBottomsis a46 y.o.malewith a known history of anxiety, tobacco abuse, hypertension not on any medications comes to the emergency room with chest pain started off this morning. Patient came to the emergency room found to have STT changes in inferior lateral leads. His troponin was 1.18. He's being admitted with acute NST EMI  1.Acute NSTEMI -cardiology consultation appreciated -on aspirin, Nitro paste, received IVheparin drip, beta blockers, statins and ARB -s/p cardiac catheterization  1. Severe single-vessel  coronary artery disease with sequential 70% proximal and 95% mid RCA stenoses. 2. Mild to moderate, nonobstructive coronary artery disease involving the LAD and diagonal branch. 3. Normal left ventricular contraction (LVEF > 65%) with mildly elevated left  ventricular filling pressure. Successful PCI to proximal and mid RCA using a Xience Sierra 3.0 x 28 mm drug-eluting stent with 0% residual stenosis and TIMI-3 flow.  2.Malignant/uncontrolled hypertension -continue beta-blockers,and ace inhibitors   3. Anxiety continues citalopram  4.Tobacco abuse smoking cessation counseling done more than four minutes spent   Case discussed with Care Management/Social Worker. Management plans discussed with the patient, family and they are in agreement.  CODE STATUS: full  DVT Prophylaxis: heparin  TOTAL TIME TAKING CARE OF THIS PATIENT: *30minutes.  >50% time spent on counselling and coordination of care  POSSIBLE D/C IN 1DAYS, DEPENDING ON CLINICAL CONDITION.  Note: This dictation was prepared with Dragon dictation along with smaller phrase technology. Any transcriptional errors that result from this process are unintentional.  Enedina FinnerSona Maurizio Geno M.D  at 10:22 AM  Between 7am to 6pm - Pager - [phone]  After 6pm go to [email or web address] - password [email or web address]  [facility] [place] [facility]  Office  [phone]  CC: Primary care physician; Reubin MilanBerglund, Laura H, MDPatient ID: Bonner PunaPatrick K Minchey, male   DOB: 25-Feb-1971, 47 y.o.   MRN: [id number]

## 2017-08-21 NOTE — Telephone Encounter (Signed)
Hospital f/u from [facility] made for April 15 at 9:30.

## 2017-08-21 NOTE — Progress Notes (Signed)
47 year old male with history of anxiety, tobacco abuse, and untreated HTN who was admitted on 08/19/2017 with NSTEMI.  Patient s/p Cardiac Cath with PCI/DES to the RCA.  PTA the only medication the patient was on Lexapro.    "Heart Attack Bouncing Back" booklet given and reviewed with patient. Discussed the definition of CAD. Reviewed the location of CAD and where his stent was placed. Informed patient he will be given a stent card.   Explained the purpose of the stent card. Instructed patient to keep stent card in his wallet.  ? Discussed modifiable risk factors including controlling blood pressure, cholesterol, and blood sugar; following heart healthy diet; maintaining healthy weight; exercise; and smoking cessation.   ? Discussed cardiac medications including rationale for taking, mechanisms of action, and side effects. Stressed the importance of taking medications as prescribed.  ? Discussed emergency plan for heart attack symptoms. Patient verbalized understanding of need to call 911 and not to drive himself to ER if having cardiac symptoms / chest pain.  ? Heart healthy diet of low sodium, low fat, low cholesterol heart healthy diet discussed. Information on diet provided. Note:  Diet education has been completed by Dietitian.   ? Smoking Cessation - Patient is a current every day smoker. Patient reports smoking 1/2 pack to 3/4 pack per day. Patient has been smoking since his early 7920s.  Patient has never tired to quit.  "Thinking about Quitting - Yes You Can!" informational sheet reviewed with patient.  Patient reported to this nurse that he did find out that his insurance company will cover Chantix.    Exercise - Benefits of exercised discussed. Patient reports it has been about 6 months since he has been to the gym.  Patient does enjoy riding motorbikes and rock climbing.  Informed patient that his cardiologist has referred him to outpatient Cardiac Rehab. An overview of the program was  provided.  Brochure, informational letter, class and orientation times, and CPT billing codes given to patient. Patient is interested in participating.  Patient plans to check with his insurance company to see what his out-of-pocket expenses will be.   ? Patient appreciative of the information.   Army Meliaiane AmeLie Hollars, RN, BSN, [facility]  [place]  [facility]  Cardiovascular & Pulmonary Nurse Navigator  Direct Line: [phone]  Department Phone #: [phone] Fax: [phone]  Email Address: [email or web address]

## 2017-08-21 NOTE — Telephone Encounter (Signed)
[facility]. Discharge summary indicates pt presented for NSTEMI and malignant HTN. Discharge planning includes the following:  - Referred to cardiac rehab - start ASA, Lipitor, Coreg, Losartan, Nitrostat and Brilinta - stop Meloxicam - f/u w/ Nicolasa Duckinghristopher Berge, NP on 08/27/17 @ 3pm - f/u w/ Dr. Judithann GravesBerglund on 08/31/17 @ 9:30am  Before placing a call to this pt for TOC f/u, based on the above information, do you prefer pt to be followed by cardio rather than keeping the hosp f/u appt with you on 08/31/17?

## 2017-08-21 NOTE — Plan of Care (Signed)
Right radial site without complications.

## 2017-08-21 NOTE — Telephone Encounter (Signed)
  TCM....  Patient is being discharged 08/21/17  They saw Nicolasa DuckingChristopher Berge  They are scheduled to see Nicolasa DuckingChristopher Berge on 08/27/17  They were seen for NSTEMI  They need to be seen within 1 week  Pt not added wait list   Please call

## 2017-08-21 NOTE — Progress Notes (Signed)
Progress Note  Patient Name: Nathan Gray Date of Encounter: 08/21/2017  Primary Cardiologist: Yvonne Kendall, MD  Subjective   No chest pain or sob overnight.  Ambulating w/o difficulty.  BP cont to trend in the 150's to 160's.  Eager to go home.  Inpatient Medications    Scheduled Meds: . aspirin  81 mg Oral Daily  . atorvastatin  80 mg Oral q1800  . carvedilol  6.25 mg Oral BID WC  . citalopram  20 mg Oral Daily  . enoxaparin (LOVENOX) injection  40 mg Subcutaneous Q24H  . feeding supplement (ENSURE ENLIVE)  237 mL Oral Q24H  . losartan  50 mg Oral Daily  . sodium chloride flush  3 mL Intravenous Q12H  . ticagrelor  90 mg Oral BID   Continuous Infusions: . sodium chloride     PRN Meds: sodium chloride, acetaminophen **OR** acetaminophen, labetalol, nitroGLYCERIN, ondansetron **OR** ondansetron (ZOFRAN) IV, sodium chloride flush, traMADol   Vital Signs    Vitals:   08/20/17 1825 08/20/17 2007 08/20/17 2111 08/21/17 0556  BP: (!) 145/80 (!) 165/111 (!) 153/90 (!) 160/99  Pulse:  66 (!) 59 (!) 55  Resp:  17  19  Temp:  97.6 F (36.4 C)  97.7 F (36.5 C)  TempSrc:  Oral  Oral  SpO2:  100% 97% 100%  Weight:      Height:        Intake/Output Summary (Last 24 hours) at 08/21/2017 0840 Last data filed at 08/21/2017 0724 Gross per 24 hour  Intake 723 ml  Output 650 ml  Net 73 ml   Filed Weights   08/19/17 0945 08/19/17 1440 08/20/17 0443  Weight: 178 lb (80.7 kg) 178 lb (80.7 kg) 178 lb 9.6 oz (81 kg)    Physical Exam   GEN: Well nourished, well developed, in no acute distress.  HEENT: Grossly normal.  Neck: Supple, no JVD, carotid bruits, or masses. Cardiac: RRR, no murmurs, rubs, or gallops. No clubbing, cyanosis, edema.  Radials/DP/PT 2+ and equal bilaterally. R wrist cath site w/o bleeding/bruit/hematoma. Respiratory:  Respirations regular and unlabored, clear to auscultation bilaterally. GI: Soft, nontender, nondistended, BS + x 4. MS: no deformity  or atrophy. Skin: warm and dry, no rash. Neuro:  Strength and sensation are intact. Psych: AAOx3.  Normal affect.  Labs    Chemistry Recent Labs  Lab 08/19/17 0949 08/20/17 0343 08/21/17 0058  NA 139 141 139  K 3.7 3.8 3.8  CL 104 105 105  CO2 GLUCOSE 82 89 102*  BUN 14 17 24*  CREATININE 0.88 0.88 0.79  CALCIUM 9.1 8.9 8.9  PROT  --   --  6.6  ALBUMIN  --   --  3.7  AST  --   --  41  ALT  --   --  33  ALKPHOS  --   --  52  BILITOT  --   --  0.6  GFRNONAA >60 >60 >60  GFRAA >60 >60 >60  ANIONGAP Hematology Recent Labs  Lab 08/19/17 0949 08/20/17 0343  WBC 7.1 7.9  RBC 4.70 4.33*  HGB 15.2 14.0  HCT 43.9 40.9  MCV 93.4 94.4  MCH 32.3 32.4  MCHC 34.5 34.4  RDW 12.7 12.9  PLT 222 208    Cardiac Enzymes Recent Labs  Lab 08/19/17 2044 08/19/17 2345 08/20/17 0343 08/20/17 1030  TROPONINI 2.06* 2.00* 2.31* 1.73*     Radiology  Dg Chest 2 View  Result Date: 08/19/2017 CLINICAL DATA:  Chest pain over the last few days. EXAM: CHEST - 2 VIEW COMPARISON:  07/02/2006 FINDINGS: Heart size is normal. Mediastinal shadows are normal. The lungs are clear. No bronchial thickening. No infiltrate, mass, effusion or collapse. Pulmonary vascularity is normal. No bony abnormality. IMPRESSION: Normal chest Electronically Signed   By: Paulina Fusi M.D.   On: 08/19/2017 10:24    Telemetry    rsr - Personally Reviewed  Cardiac Studies   Cardiac Catheterization 4.3.2019      Left Main  Vessel is large. Vessel is angiographically normal.  Left Anterior Descending  Vessel is large.  Ost LAD lesion 30% stenosed  Ost LAD lesion is 30% stenosed. The lesion is eccentric. The lesion is mildly calcified.  Mid LAD lesion 10% stenosed  Mid LAD lesion is 10% stenosed.  First Diagonal Branch   Vessel is large in size.   Ost 1st Diag lesion 50% stenosed   Ost 1st Diag lesion is 50% stenosed.   Ramus Intermedius  Vessel is moderate in size. Vessel is  angiographically normal.  Left Circumflex  Vessel is small. Vessel is angiographically normal.  Right Coronary Artery    Vessel is moderate in size.    Prox RCA lesion 70% stenosed    Prox RCA lesion is 70% stenosed.    Mid RCA lesion 95% stenosed    Mid RCA lesion is 95% stenosed.    **RCA successfully stented w/ 3.0x28 Sierra DES Left Ventricle The left ventricular size is normal. The left ventricular systolic function is normal. LV end diastolic pressure is mildly elevated. The left ventricular ejection fraction is greater than 65% by visual estimate. No regional wall motion abnormalities. There is no evidence of mitral regurgitation.   _____________  2D Echocardiogram 4.4.2019  Study Conclusions   - Left ventricle: The cavity size was normal. There was moderate   concentric hypertrophy. Systolic function was vigorous. The   estimated ejection fraction was in the range of 65% to 70%. Wall   motion was normal; there were no regional wall motion   abnormalities. Left ventricular diastolic function parameters   were normal. - Left atrium: The atrium was mildly dilated.  Patient Profile     47 y.o. male w/ h/o anxiety, tob abuse, and untreated HTN, admitted 4/3 with NSTEMI, now s/p PCI/DES to the RCA.  Assessment & Plan    1.  NSTEMI/CAD: s/p PCI/DES to the prox/mid RCA on 4/3.  Ambulated throughout the day yesterday without difficulty or recurrent Ss.  No c/p this AM.  Eager to go home.  Cont asa, statin,  blocker, ARB, and brilinta.  He is interested in cardiac rehab.  Ok to d/c and we will arrange for f/u w/in 7 days.  2.  Essential HTN:  Likely long h/o untreated HTN given LVH noted on echo/ECG.  Cont coreg @ current dose - bradycardia will limit any further titration.  Losartan started yesterday and I will titrate to 50 mg daily this AM.  Will likely need further escalation as outpt.  3.  HL:  LDL 94  cont high potency statin rx.  4.  Tob Abuse:  Complete cessation advised.   He is motivated to quit.  5.  Anxiety:  Stable on home dose of celexa.  Signed, Nicolasa Ducking, NP  08/21/2017, 8:40 AM    For questions or updates, please contact   Please consult [email or web address] for contact info under Cardiology/STEMI.

## 2017-08-21 NOTE — Discharge Summary (Signed)
[facility] -  at [facility]   PATIENT NAME: Nathan Gray    MR#:  [id number]  DATE OF BIRTH:  1970/06/29  DATE OF ADMISSION:  08/19/2017 ADMITTING PHYSICIAN: Enedina FinnerSona Aarion Kittrell, MD  DATE OF DISCHARGE: 08/21/2017  PRIMARY CARE PHYSICIAN: Reubin MilanBerglund, Laura H, MD    ADMISSION DIAGNOSIS:  NSTEMI (non-ST elevated myocardial infarction) (HCC) [I21.4]  DISCHARGE DIAGNOSIS:  Acute NSTEMI Malignant HTN  SECONDARY DIAGNOSIS:   Past Medical History:  Diagnosis Date  . Anxiety   . Hypertension   . Tobacco abuse     HOSPITAL COURSE:   Nathan Boldatrick Rigsbee  is a 47 y.o. male with a known history of anxiety, tobacco abuse, hypertension not on any medications comes to the emergency room with chest pain started off this morning. Patient came to the emergency room found to have STT changes in inferior lateral leads. His troponin was 1.18. He's being admitted with acute NST EMI  1.Acute NSTEMI -cardiology consultation appreciated -on aspirin, Nitro paste, received IVheparin drip, beta blockers, statins and ARB -s/p cardiac catheterization  1. Severe single-vessel coronary artery disease with sequential 70% proximal and 95% mid RCA stenoses. 2. Mild to moderate, nonobstructive coronary artery disease involving the LAD and diagonal branch. 3. Normal left ventricular contraction (LVEF > 65%) with mildly elevated left ventricular filling pressure. Successful PCI to proximal and mid RCA using a Xience Sierra 3.0 x 28 mm drug-eluting stent with 0% residual stenosis and TIMI-3 flow.  2. Malignant/uncontrolled hypertension -continue beta-blockers, and ace inhibitors   3. Anxiety continues citalopram  4. Tobacco abuse smoking cessation counseling done more than four minutes spent  Overall stable. Pt will get cardiac rehab. Seen dietitian also. D/c home  CONSULTS OBTAINED:  Treatment Team:  Yvonne KendallEnd, Christopher, MD  DRUG ALLERGIES:   Allergies  Allergen Reactions  .  Amoxicillin   . Penicillins     Has patient had a PCN reaction causing immediate rash, facial/tongue/throat swelling, SOB or lightheadedness with hypotension: Unknown Has patient had a PCN reaction causing severe rash involving mucus membranes or skin necrosis: Unknown Has patient had a PCN reaction that required hospitalization: Unknown Has patient had a PCN reaction occurring within the last 10 years: Unknown If all of the above answers are "NO", then may proceed with Cephalosporin use.    DISCHARGE MEDICATIONS:   Allergies as of 08/21/2017      Reactions   Amoxicillin    Penicillins    Has patient had a PCN reaction causing immediate rash, facial/tongue/throat swelling, SOB or lightheadedness with hypotension: Unknown Has patient had a PCN reaction causing severe rash involving mucus membranes or skin necrosis: Unknown Has patient had a PCN reaction that required hospitalization: Unknown Has patient had a PCN reaction occurring within the last 10 years: Unknown If all of the above answers are "NO", then may proceed with Cephalosporin use.      Medication List    STOP taking these medications   meloxicam 15 MG tablet Commonly known as:  MOBIC     TAKE these medications   aspirin 81 MG chewable tablet Chew 1 tablet (81 mg total) by mouth daily. Start taking on:  08/22/2017   atorvastatin 80 MG tablet Commonly known as:  LIPITOR Take 1 tablet (80 mg total) by mouth daily at 6 PM.   carvedilol 6.25 MG tablet Commonly known as:  COREG Take 1 tablet (6.25 mg total) by mouth 2 (two) times daily with a meal.   citalopram 20 MG tablet Commonly  known as:  CELEXA TAKE 1 TABLET (20 MG TOTAL) BY MOUTH DAILY.   losartan 50 MG tablet Commonly known as:  COZAAR Take 1 tablet (50 mg total) by mouth daily. Start taking on:  08/22/2017   nitroGLYCERIN 0.4 MG SL tablet Commonly known as:  NITROSTAT Place 1 tablet (0.4 mg total) under the tongue every 5 (five) minutes as needed for chest  pain.   ticagrelor 90 MG Tabs tablet Commonly known as:  BRILINTA Take 1 tablet (90 mg total) by mouth 2 (two) times daily.       If you experience worsening of your admission symptoms, develop shortness of breath, life threatening emergency, suicidal or homicidal thoughts you must seek medical attention immediately by calling 911 or calling your MD immediately  if symptoms less severe.  You Must read complete instructions/literature along with all the possible adverse reactions/side effects for all the Medicines you take and that have been prescribed to you. Take any new Medicines after you have completely understood and accept all the possible adverse reactions/side effects.   Please note  You were cared for by a hospitalist during your hospital stay. If you have any questions about your discharge medications or the care you received while you were in the hospital after you are discharged, you can call the unit and asked to speak with the hospitalist on call if the hospitalist that took care of you is not available. Once you are discharged, your primary care physician will handle any further medical issues. Please note that NO REFILLS for any discharge medications will be authorized once you are discharged, as it is imperative that you return to your primary care physician (or establish a relationship with a primary care physician if you do not have one) for your aftercare needs so that they can reassess your need for medications and monitor your lab values. Today   SUBJECTIVE    Doing well VITAL SIGNS:  Blood pressure (!) 160/100, pulse 64, temperature 97.7 F (36.5 C), temperature source Oral, resp. rate 14, height 5\' 11"  (1.803 m), weight 81 kg (178 lb 9.6 oz), SpO2 100 %.  I/O:    Intake/Output Summary (Last 24 hours) at 08/21/2017 1019 Last data filed at 08/21/2017 0900 Gross per 24 hour  Intake 360 ml  Output 1200 ml  Net -840 ml    PHYSICAL EXAMINATION:  GENERAL:  47  y.o.-year-old patient lying in the bed with no acute distress.  EYES: Pupils equal, round, reactive to light and accommodation. No scleral icterus. Extraocular muscles intact.  HEENT: Head atraumatic, normocephalic. Oropharynx and nasopharynx clear.  NECK:  Supple, no jugular venous distention. No thyroid enlargement, no tenderness.  LUNGS: Normal breath sounds bilaterally, no wheezing, rales,rhonchi or crepitation. No use of accessory muscles of respiration.  CARDIOVASCULAR: S1, S2 normal. No murmurs, rubs, or gallops.  ABDOMEN: Soft, non-tender, non-distended. Bowel sounds present. No organomegaly or mass.  EXTREMITIES: No pedal edema, cyanosis, or clubbing.  NEUROLOGIC: Cranial nerves II through XII are intact. Muscle strength 5/5 in all extremities. Sensation intact. Gait not checked.  PSYCHIATRIC: The patient is alert and oriented x 3.  SKIN: No obvious rash, lesion, or ulcer.   DATA REVIEW:   CBC  Recent Labs  Lab 08/20/17 0343  WBC 7.9  HGB 14.0  HCT 40.9  PLT 208    Chemistries  Recent Labs  Lab 08/21/17 0058  NA 139  K 3.8  CL 105  CO2 28  GLUCOSE 102*  BUN 24*  CREATININE 0.79  CALCIUM 8.9  AST 41  ALT 33  ALKPHOS 52  BILITOT 0.6    Microbiology Results   No results found for this or any previous visit (from the past 240 hour(s)).  RADIOLOGY:  Dg Chest 2 View  Result Date: 08/19/2017 CLINICAL DATA:  Chest pain over the last few days. EXAM: CHEST - 2 VIEW COMPARISON:  07/02/2006 FINDINGS: Heart size is normal. Mediastinal shadows are normal. The lungs are clear. No bronchial thickening. No infiltrate, mass, effusion or collapse. Pulmonary vascularity is normal. No bony abnormality. IMPRESSION: Normal chest Electronically Signed   By: Paulina Fusi M.D.   On: 08/19/2017 10:24     Management plans discussed with the patient, family and they are in agreement.  CODE STATUS:     Code Status Orders  (From admission, onward)        Start     Ordered    08/19/17 1724  Full code  Continuous     08/19/17 1723    Code Status History    Date Active Date Inactive Code Status Order ID Comments User Context   08/19/2017 1720 08/19/2017 1723 Full Code [id number]  Yvonne Kendall, MD Inpatient      TOTAL TIME TAKING CARE OF THIS PATIENT: 40 minutes.    Enedina Finner M.D on 08/21/2017 at 10:19 AM  Between 7am to 6pm - Pager - [phone] After 6pm go to [email or web address] - Social research officer, government  [facility]  Office  [phone]  CC: Primary care physician; Reubin Milan, MD

## 2017-08-21 NOTE — Telephone Encounter (Signed)
Patient currently admitted at this time. 

## 2017-08-21 NOTE — Progress Notes (Signed)
Patient discharged via wheelchair and private vehicle. IV removed and catheter intact. All discharge instructions given and patient verbalizes understanding. Tele removed and returned. Prescriptions given to patient No distress noted.   

## 2017-08-22 NOTE — Telephone Encounter (Signed)
Yes, let him keep the follow up.

## 2017-08-24 NOTE — Telephone Encounter (Signed)
Transition Care Management Follow-Up Telephone Call   Date discharged and where: 08/21/17 from [facility]  How have you been since you were released from the hospital?  States he is feeling much better. Further states, "just setting in that I had a heart attack", "that was scary". Denies any chest pain, dyspnea, headache, numbness or tingling in extremities. Denies any adverse reactions to newly prescribed medications. States he has not yet scheduled an appt with cardiac rehab and wanted to wait until after he was seen by cardiology tomorrow.  Recommended pt monitor B/P and weight daily. Also advised to monitor lower extremities for s/sx edema. Advised to ensure he updates his cardiologist with this information. Verbalized acceptance and understanding.  Any patient concerns? Denies any questions or concerns at this time.  Items Reviewed: Verified = V   Meds: V  Allergies: V  Dietary Orders: Heart healthy diet. Verbalized acceptance and understanding of dietary restrictions/limitations  Functional Questionnaire:  Independent = I Dependent = D  ADLs: I  Dressing- I   Eating- I  Maintaining continence- I  Transferring- I  Transportation- I  Meal Prep- I  Managing Meds- I  Additional Items Reviewed:  Hospital f/u appt confirmed? Yes. Scheduled to see Dr. Judithann GravesBerglund on 08/31/17 @ 9:30am. Denies need for transportation arrangements. Also confirmed f/u appt with Nicolasa Duckinghristopher Berge, NP on 08/27/17 @ 3:00pm. Appt with cardiac rehab not yet scheduled.  If their condition worsens, is the pt aware to call PCP or go to the Emergency Dept.? Yes. Verbalized acceptance and understanding  Was the patient provided with contact information for the PCP's office or ED? Yes  Was to pt encouraged to call back with questions or concerns? Yes

## 2017-08-24 NOTE — Telephone Encounter (Signed)
I left a message for the patient to call. 

## 2017-08-25 ENCOUNTER — Telehealth: Payer: Self-pay | Admitting: Internal Medicine

## 2017-08-25 NOTE — Telephone Encounter (Signed)
Patient contacted regarding discharge from [facility] on 08/21/17.  Patient understands to follow up with provider Ward Givenshris Berge, NP on 08/27/17 at 3pm at [facility] [place]. Patient understands discharge instructions? yesy Patient understands medications and regiment? yes Patient understands to bring all medications to this visit? yes  Provided fax number to patient for short term disability paperwork.

## 2017-08-25 NOTE — Telephone Encounter (Signed)
Received Request from [employer] for std claim.  Fwd to [employer] via interoffice mail.

## 2017-08-27 ENCOUNTER — Ambulatory Visit: Payer: 59 | Admitting: Nurse Practitioner

## 2017-08-27 ENCOUNTER — Encounter: Payer: Self-pay | Admitting: Nurse Practitioner

## 2017-08-27 VITALS — BP 128/76 | HR 75 | Ht 71.0 in | Wt 175.8 lb

## 2017-08-27 DIAGNOSIS — E785 Hyperlipidemia, unspecified: Secondary | ICD-10-CM

## 2017-08-27 DIAGNOSIS — I2510 Atherosclerotic heart disease of native coronary artery without angina pectoris: Secondary | ICD-10-CM | POA: Diagnosis not present

## 2017-08-27 DIAGNOSIS — I10 Essential (primary) hypertension: Secondary | ICD-10-CM

## 2017-08-27 DIAGNOSIS — I214 Non-ST elevation (NSTEMI) myocardial infarction: Secondary | ICD-10-CM | POA: Diagnosis not present

## 2017-08-27 DIAGNOSIS — Z720 Tobacco use: Secondary | ICD-10-CM

## 2017-08-27 MED ORDER — ATORVASTATIN CALCIUM 80 MG PO TABS: 80.0000 mg | ORAL_TABLET | Freq: Every day | ORAL | 3 refills | Status: DC

## 2017-08-27 MED ORDER — CARVEDILOL 6.25 MG PO TABS: 6.2500 mg | ORAL_TABLET | Freq: Two times a day (BID) | ORAL | 3 refills | Status: DC

## 2017-08-27 MED ORDER — TICAGRELOR 90 MG PO TABS: 90.0000 mg | ORAL_TABLET | Freq: Two times a day (BID) | ORAL | 3 refills | Status: DC

## 2017-08-27 MED ORDER — LOSARTAN POTASSIUM 50 MG PO TABS: 50.0000 mg | ORAL_TABLET | Freq: Every day | ORAL | 3 refills | Status: DC

## 2017-08-27 NOTE — Progress Notes (Signed)
Office Visit    Patient Name: Nathan Gray Date of Encounter: 08/27/2017  Primary Care Provider:  Reubin MilanBerglund, Laura H, MD Primary Cardiologist:  Yvonne Kendallhristopher End, MD  Chief Complaint    47 year old male status post recent non-STEMI with prior history of untreated hypertension, tobacco abuse, and anxiety, who presents for post hospital follow-up.  Past Medical History    Past Medical History:  Diagnosis Date  . Anxiety   . CAD (coronary artery disease)    a. 08/2017 NSTEMI/PCI: LM nl, LAD 30ost, 9424m, D1 50ost, RI nl, LCX small/nl, RCA 70p/7945m (3.0x28 [place]ierra DES), EF 65%.  . History of echocardiogram    a. 08/2017 Echo: EF 65-70%, no rwma, mildly dil LA.  Marland Kitchen. Hyperlipidemia   . Hypertension   . Tobacco abuse    Past Surgical History:  Procedure Laterality Date  . APPENDECTOMY    . CORONARY STENT INTERVENTION N/A 08/19/2017   Procedure: CORONARY STENT INTERVENTION;  Surgeon: Yvonne KendallEnd, Daylen Lipsky, MD;  Location: [facility] INVASIVE CV LAB;  Service: Cardiovascular;  Laterality: N/A;  . LEFT HEART CATH AND CORONARY ANGIOGRAPHY N/A 08/19/2017   Procedure: LEFT HEART CATH AND CORONARY ANGIOGRAPHY;  Surgeon: Yvonne KendallEnd, Hisashi Amadon, MD;  Location: [facility] INVASIVE CV LAB;  Service: Cardiovascular;  Laterality: N/A;    Allergies  Allergies  Allergen Reactions  . Amoxicillin   . Penicillins     Has patient had a PCN reaction causing immediate rash, facial/tongue/throat swelling, SOB or lightheadedness with hypotension: Unknown Has patient had a PCN reaction causing severe rash involving mucus membranes or skin necrosis: Unknown Has patient had a PCN reaction that required hospitalization: Unknown Has patient had a PCN reaction occurring within the last 10 years: Unknown If all of the above answers are "NO", then may proceed with Cephalosporin use.    History of Present Illness    47 year old male with prior history of anxiety, tobacco abuse, and untreated hypertension.  He also has a family history  of CAD with his father suffering an MI in his mid 6150s and subsequently required CABG.  [facility] with a 1 day history of intermittent rest and exertional chest discomfort associated with dyspnea.  In the emergency department, he was markedly hypertensive with a blood pressure of 182/114.  ECG showed mild inferior ST segment depression with T changes along with LVH.  Troponin was abnormal.  He underwent diagnostic catheterization revealing severe proximal and mid RCA stenosis with otherwise nonobstructive disease and normal LV function.  The RCA was successfully stented with a drug-eluting stent.  Follow-up echocardiogram showed an EF of 65-70%.  Significant LVH was noted suggestive of a long history of hypertension.  Postprocedure course was uneventful and he was discharged home on aspirin, statin, beta-blocker, ARB, and Brilinta therapy.  Blood pressure was reasonable at discharge.  Since discharge, he has done well.  He says he has more energy than he has had in a year and a half.  He has been walking some without any recurrence of chest pain or dyspnea.  He is tolerating all of his medications well.  He does have a blood pressure cuff and has noted an occasional blood pressure of 160 systolic.  Today, he is 128/76 and then 110/70 on repeat  performed by me.  He denies PND, orthopnea, dizziness, syncope, edema, or early satiety.  He has smoked 4 cigarettes since his discharge and says he plans to quit on Sunday.  Home Medications    Prior to Admission medications  Medication Sig Start Date End Date Taking? Authorizing Provider  aspirin 81 MG chewable tablet Chew 1 tablet (81 mg total) by mouth daily. 08/22/17  Yes Enedina Finner, MD  atorvastatin (LIPITOR) 80 MG tablet Take 1 tablet (80 mg total) by mouth daily at 6 PM. 08/21/17  Yes Enedina Finner, MD  carvedilol (COREG) 6.25 MG tablet Take 1 tablet (6.25 mg total) by mouth 2 (two) times daily with a meal. 08/21/17  Yes Enedina Finner, MD  citalopram (CELEXA) 20 MG tablet TAKE 1 TABLET (20 MG TOTAL) BY MOUTH DAILY. 07/07/17  Yes Reubin Milan, MD  losartan (COZAAR) 50 MG tablet Take 1 tablet (50 mg total) by mouth daily. 08/22/17  Yes Enedina Finner, MD  nitroGLYCERIN (NITROSTAT) 0.4 MG SL tablet Place 1 tablet (0.4 mg total) under the tongue every 5 (five) minutes as needed for chest pain. 08/21/17  Yes Enedina Finner, MD  ticagrelor (BRILINTA) 90 MG TABS tablet Take 1 tablet (90 mg total) by mouth 2 (two) times daily. 08/21/17  Yes Enedina Finner, MD    Review of Systems    He denies chest pain, palpitations, dyspnea, pnd, orthopnea, n, v, dizziness, syncope, edema, weight gain, or early satiety.  All other systems reviewed and are otherwise negative except as noted above.  Physical Exam    VS:  BP 128/76 (BP Location: Left Arm, Patient Position: Sitting, Cuff Size: Normal)   Pulse 75   Ht 5\' 11"  (1.803 m)   Wt 175 lb 12 oz (79.7 kg)   BMI 24.51 kg/m  , BMI Body mass index is 24.51 kg/m. GEN: Well nourished, well developed, in no acute distress.  HEENT: normal.  Neck: Supple, no JVD, carotid bruits, or masses. Cardiac: RRR, no murmurs, rubs, or gallops. No clubbing, cyanosis, edema.  Radials/DP/PT 2+ and equal bilaterally.  Respiratory:  Respirations regular and unlabored, clear to auscultation bilaterally.  Right radial cath site without bleeding, bruit, or hematoma. GI: Soft, nontender, nondistended, BS + x 4. MS: no deformity or atrophy. Skin: warm and dry, no rash. Neuro:  Strength and sensation are intact. Psych: Normal affect.  Accessory Clinical Findings    ECG -regular sinus rhythm, 75, inferolateral ST and T changes.  No acute changes.  Assessment & Plan    1.  Non-STEMI, subsequent episode of care/CAD: Status post recent presentation with intermittent chest pain, elevated troponin, and subsequent finding of severe RCA disease.  Now status post drug-eluting stent placement.  LV function was normal.  He has  been doing well since discharge.  He has been walking some without symptoms or limitations.  He remains on aspirin, beta-blocker, statin, ARB, and Brilinta therapy.  He is tolerating all of these well.  I will refill today.  He is interested in cardiac rehab and plans to participate.  Referral already made.  2.  Essential hypertension: Blood pressure was markedly elevated upon hospital presentation and improved after initiation of beta-blocker and ARB therapy.  He says his pressure was 160 at home last night however he was 128/76 upon arrival today and I just rechecked it and he is 110/70 currently.  In that setting, I have asked him to continue to follow his blood pressures at home but we will plan to continue carvedilol 6.25 mg twice daily and losartan 50 mg daily.  He will contact us if pressures are trending greater than 130 at which point, I would titrate carvedilol.  3.  Hyperlipidemia: LDL was 94 during hospitalization with a total cholesterol  192.  He is currently on Lipitor 80 mg in the setting of recent ACS.  Plan follow-up lipids and LFTs in 6 weeks.  4.  Tobacco abuse: As above, he is smoked 4 cigarettes since discharge.  He is very motivated to quit and plans on quitting on Sunday.  We discussed cessation aids and he says he wants to give it a try on his own first.  Complete cessation advised.  5.  Anxiety: Stable on previous home dose of Celexa.  6.  Disposition: Follow-up lipids and LFTs in 6 weeks.  He will contact us if he notes elevated blood pressures at home.  Follow-up with Dr. Okey Dupre in 2 months or sooner if necessary.  Nicolasa Ducking, NP 08/27/2017, 3:05 PM

## 2017-08-27 NOTE — Telephone Encounter (Signed)
Patient in office for visit.  Signed ROI while at checkout.  Scanned to documents

## 2017-08-27 NOTE — Patient Instructions (Signed)
Medication Instructions: - Your physician recommends that you continue on your current medications as directed. Please refer to the Current Medication list given to you today.  (your cardiac medications have been refilled for you today)  Labwork: - Your physician recommends that you return for FASTING lab work in: 6 weeks- lipid/ liver  Procedures/Testing: - none ordered  Follow-Up: - Your physician recommends that you schedule a follow-up appointment in: 2 months with Dr. Okey DupreEnd.   Any Additional Special Instructions Will Be Listed Below (If Applicable).     If you need a refill on your cardiac medications before your next appointment, please call your pharmacy.

## 2017-08-31 ENCOUNTER — Encounter: Payer: Self-pay | Admitting: Internal Medicine

## 2017-08-31 ENCOUNTER — Ambulatory Visit: Payer: 59 | Admitting: Internal Medicine

## 2017-08-31 VITALS — BP 114/82 | HR 73 | Ht 71.0 in | Wt 176.0 lb

## 2017-08-31 DIAGNOSIS — I10 Essential (primary) hypertension: Secondary | ICD-10-CM | POA: Insufficient documentation

## 2017-08-31 DIAGNOSIS — F419 Anxiety disorder, unspecified: Secondary | ICD-10-CM | POA: Diagnosis not present

## 2017-08-31 DIAGNOSIS — F17200 Nicotine dependence, unspecified, uncomplicated: Secondary | ICD-10-CM | POA: Diagnosis not present

## 2017-08-31 DIAGNOSIS — I214 Non-ST elevation (NSTEMI) myocardial infarction: Secondary | ICD-10-CM

## 2017-08-31 NOTE — Progress Notes (Signed)
Date:  08/31/2017   Name:  Nathan Gray   DOB:  1970/11/08   MRN:  [id number]   Chief Complaint: Hospitalization Follow-up (Feeling good since hospital stay. Stayed for 2.5 days. No issues since the stay. ) Hospital follow up: Admitted to [facility] 08/19/17 to 08/21/17 with acute MI. Received TOC call on 08/24/17. DC summary: 1.Acute NSTEMI -cardiology consultation appreciated -on aspirin, Nitro paste, received IVheparin drip, beta blockers, statins and ARB -s/p cardiac catheterization  1. Severe single-vessel coronary artery disease with sequential 70% proximal and 95% mid RCA stenoses. 2. Mild to moderate, nonobstructive coronary artery disease involving the LAD and diagonal branch. 3. Normal left ventricular contraction (LVEF > 65%) with mildly elevated left ventricular filling pressure. Successful PCI to proximal and mid RCA using a Xience Sierra 3.0 x 28 mm drug-eluting stent with 0% residual stenosis and TIMI-3 flow.  He had done well since hospitalization.  He has more energy.  He is planning to do cardiac rehab.  His goal is to quit smoking by the time he goes back to work in 2 weeks.  Hypertension  This is a chronic problem. The problem has been rapidly improving since onset. The problem is controlled. Associated symptoms include anxiety. Pertinent negatives include no chest pain, headaches, palpitations or shortness of breath. Past treatments include ACE inhibitors and beta blockers. The current treatment provides significant improvement. Hypertensive end-organ damage includes CAD/MI.  Nicotine Dependence  Presents for follow-up visit. Symptoms are negative for fatigue. His urge triggers include company of smokers. He smokes < 1/2 a pack (about one per day) of cigarettes per day. Compliance with prior treatments has been variable (trying to quit on his own but may want to try chantix).  Anxiety  Presents for follow-up visit. Patient reports no chest pain, dizziness, excessive worry,  nausea, nervous/anxious behavior, palpitations, panic, restlessness or shortness of breath. Symptoms occur rarely. The severity of symptoms is mild. The quality of sleep is good.     Review of Systems  Constitutional: Negative for chills, fatigue, fever and unexpected weight change.  Eyes: Negative for visual disturbance.  Respiratory: Negative for cough, chest tightness, shortness of breath and wheezing.   Cardiovascular: Negative for chest pain and palpitations.  Gastrointestinal: Negative for abdominal pain, diarrhea and nausea.  Musculoskeletal: Negative for arthralgias and myalgias.  Skin: Negative for rash.  Neurological: Negative for dizziness, light-headedness and headaches.  Hematological: Negative for adenopathy. Does not bruise/bleed easily.  Psychiatric/Behavioral: Negative for dysphoric mood and sleep disturbance. The patient is not nervous/anxious.     Patient Active Problem List   Diagnosis Date Noted  . NSTEMI (non-ST elevated myocardial infarction) (HCC) 08/19/2017  . Anxiety disorder 05/28/2016  . Other insomnia 05/28/2016  . Mild tobacco use disorder 05/28/2016    Prior to Admission medications   Medication Sig Start Date End Date Taking? Authorizing Provider  aspirin 81 MG chewable tablet Chew 1 tablet (81 mg total) by mouth daily. 08/22/17   Enedina FinnerPatel, Sona, MD  atorvastatin (LIPITOR) 80 MG tablet Take 1 tablet (80 mg total) by mouth daily at 6 PM. 08/27/17   Creig HinesBerge, Christopher Ronald, NP  carvedilol (COREG) 6.25 MG tablet Take 1 tablet (6.25 mg total) by mouth 2 (two) times daily with a meal. 08/27/17   Creig HinesBerge, Christopher Ronald, NP  citalopram (CELEXA) 20 MG tablet TAKE 1 TABLET (20 MG TOTAL) BY MOUTH DAILY. 07/07/17   Reubin MilanBerglund, Laura H, MD  losartan (COZAAR) 50 MG tablet Take 1 tablet (50 mg total)  by mouth daily. 08/27/17   Creig Hines, NP  nitroGLYCERIN (NITROSTAT) 0.4 MG SL tablet Place 1 tablet (0.4 mg total) under the tongue every 5 (five) minutes as  needed for chest pain. 08/21/17   Enedina Finner, MD  ticagrelor (BRILINTA) 90 MG TABS tablet Take 1 tablet (90 mg total) by mouth 2 (two) times daily. 08/27/17   Creig Hines, NP    Allergies  Allergen Reactions  . Amoxicillin   . Penicillins     Has patient had a PCN reaction causing immediate rash, facial/tongue/throat swelling, SOB or lightheadedness with hypotension: Unknown Has patient had a PCN reaction causing severe rash involving mucus membranes or skin necrosis: Unknown Has patient had a PCN reaction that required hospitalization: Unknown Has patient had a PCN reaction occurring within the last 10 years: Unknown If all of the above answers are "NO", then may proceed with Cephalosporin use.    Past Surgical History:  Procedure Laterality Date  . APPENDECTOMY    . CORONARY STENT INTERVENTION N/A 08/19/2017   Procedure: CORONARY STENT INTERVENTION;  Surgeon: Yvonne Kendall, MD;  Location: [facility] INVASIVE CV LAB;  Service: Cardiovascular;  Laterality: N/A;  . LEFT HEART CATH AND CORONARY ANGIOGRAPHY N/A 08/19/2017   Procedure: LEFT HEART CATH AND CORONARY ANGIOGRAPHY;  Surgeon: Yvonne Kendall, MD;  Location: [facility] INVASIVE CV LAB;  Service: Cardiovascular;  Laterality: N/A;    Social History   Tobacco Use  . Smoking status: Current Every Day Smoker    Packs/day: 0.25    Years: 18.00    Pack years: 4.50  . Smokeless tobacco: Current User  Substance Use Topics  . Alcohol use: Yes    Comment: few drinks/week  . Drug use: Yes    Types: Marijuana    Comment: smoked mj for first time in 20 yrs about 1 wk ago.     Medication list has been reviewed and updated.  PHQ 2/9 Scores 11/11/2016 05/28/2016  PHQ - 2 Score 2 0  PHQ- 9 Score 4 -    Physical Exam  Constitutional: He is oriented to person, place, and time. He appears well-developed. No distress.  HENT:  Head: Normocephalic and atraumatic.  Neck: Normal range of motion. Neck supple. Carotid bruit is not  present.  Cardiovascular: Normal rate and regular rhythm.  Pulmonary/Chest: Effort normal and breath sounds normal. No respiratory distress.  Musculoskeletal: Normal range of motion.  Neurological: He is alert and oriented to person, place, and time.  Skin: Skin is warm and dry. No rash noted.  Psychiatric: He has a normal mood and affect. His behavior is normal. Thought content normal.    BP 114/82   Pulse 73   Ht 5\' 11"  (1.803 m)   Wt 176 lb (79.8 kg)   SpO2 99%   BMI 24.55 kg/m   Assessment and Plan: 1. NSTEMI (non-ST elevated myocardial infarction) (HCC) Continue current medications F/u for rehab  2. Anxiety disorder, unspecified type Doing well on Celexa  3. Mild tobacco use disorder Continue to work on quitting on his own but return to discuss chantix if still struggling  4. Essential hypertension Controlled on losartan and beta blocker  No orders of the defined types were placed in this encounter.   Partially dictated using Animal nutritionist. Any errors are unintentional.  Bari Edward, MD [facility]  08/31/2017

## 2017-09-09 ENCOUNTER — Telehealth: Payer: Self-pay | Admitting: Internal Medicine

## 2017-09-09 NOTE — Telephone Encounter (Signed)
Received forms for Short Term Disability from [employer] Placed in Inter-office mail to [employer]

## 2017-09-14 NOTE — Telephone Encounter (Signed)
Received forms from [employer] placed in nurse box for md review.

## 2017-09-15 ENCOUNTER — Ambulatory Visit: Payer: 59 | Admitting: Physician Assistant

## 2017-09-15 NOTE — Telephone Encounter (Signed)
[employer] paperwork placed in Dr Serita Kyle basket for completion.

## 2017-09-16 ENCOUNTER — Telehealth: Payer: Self-pay | Admitting: Internal Medicine

## 2017-09-16 NOTE — Telephone Encounter (Signed)
Routing to Dr End. 

## 2017-09-16 NOTE — Telephone Encounter (Signed)
Disability forms discussed with Mr. Daffin and completed.  Yvonne Kendall, MD [facility] Pager: [phone]

## 2017-09-16 NOTE — Telephone Encounter (Signed)
Pt states he is returning Dr Serita Kyle call  Please call back

## 2017-09-17 NOTE — Telephone Encounter (Signed)
[employer] paperwork given to [facility] yesterday, 09/16/17 and sent off. See additional telephone note.

## 2017-09-17 NOTE — Telephone Encounter (Signed)
Received Disability folder from nurse, sent off to [facility]

## 2017-09-28 ENCOUNTER — Encounter: Payer: Self-pay | Admitting: *Deleted

## 2017-09-28 ENCOUNTER — Encounter: Payer: 59 | Attending: Internal Medicine | Admitting: *Deleted

## 2017-09-28 ENCOUNTER — Other Ambulatory Visit: Payer: Self-pay | Admitting: Internal Medicine

## 2017-09-28 VITALS — Ht 69.7 in | Wt 174.8 lb

## 2017-09-28 DIAGNOSIS — F419 Anxiety disorder, unspecified: Secondary | ICD-10-CM

## 2017-09-28 DIAGNOSIS — I10 Essential (primary) hypertension: Secondary | ICD-10-CM | POA: Insufficient documentation

## 2017-09-28 DIAGNOSIS — Z955 Presence of coronary angioplasty implant and graft: Secondary | ICD-10-CM | POA: Diagnosis not present

## 2017-09-28 DIAGNOSIS — I214 Non-ST elevation (NSTEMI) myocardial infarction: Secondary | ICD-10-CM

## 2017-09-28 DIAGNOSIS — I2510 Atherosclerotic heart disease of native coronary artery without angina pectoris: Secondary | ICD-10-CM | POA: Diagnosis not present

## 2017-09-28 DIAGNOSIS — F17210 Nicotine dependence, cigarettes, uncomplicated: Secondary | ICD-10-CM | POA: Diagnosis not present

## 2017-09-28 DIAGNOSIS — Z7982 Long term (current) use of aspirin: Secondary | ICD-10-CM | POA: Diagnosis not present

## 2017-09-28 DIAGNOSIS — E785 Hyperlipidemia, unspecified: Secondary | ICD-10-CM | POA: Diagnosis not present

## 2017-09-28 DIAGNOSIS — Z79899 Other long term (current) drug therapy: Secondary | ICD-10-CM | POA: Diagnosis not present

## 2017-09-28 NOTE — Progress Notes (Signed)
Cardiac Individual Treatment Plan  Patient Details  Name: Nathan Gray MRN: [id number] Date of Birth: 1970-10-25 Referring Provider:     Cardiac Rehab from 09/28/2017 in [facility] Cardiac and Pulmonary Rehab  Referring Provider  End, [name] MD      Initial Encounter Date:    Cardiac Rehab from 09/28/2017 in [facility] Cardiac and Pulmonary Rehab  Date  09/28/17  Referring Provider  End, [name] MD      Visit Diagnosis: NSTEMI (non-ST elevated myocardial infarction) [facility])  Status post coronary artery stent placement  Patient's Home Medications on Admission:  Current Outpatient Medications:  .  aspirin 81 MG chewable tablet, Chew 1 tablet (81 mg total) by mouth daily., Disp: 30 tablet, Rfl: 2 .  atorvastatin (LIPITOR) 80 MG tablet, Take 1 tablet (80 mg total) by mouth daily at 6 PM., Disp: 90 tablet, Rfl: 3 .  carvedilol (COREG) 6.25 MG tablet, Take 1 tablet (6.25 mg total) by mouth 2 (two) times daily with a meal., Disp: 180 tablet, Rfl: 3 .  citalopram (CELEXA) 20 MG tablet, TAKE 1 TABLET (20 MG TOTAL) BY MOUTH DAILY., Disp: 90 tablet, Rfl: 1 .  losartan (COZAAR) 50 MG tablet, Take 1 tablet (50 mg total) by mouth daily., Disp: 90 tablet, Rfl: 3 .  nitroGLYCERIN (NITROSTAT) 0.4 MG SL tablet, Place 1 tablet (0.4 mg total) under the tongue every 5 (five) minutes as needed for chest pain., Disp: 20 tablet, Rfl: 2 .  ticagrelor (BRILINTA) 90 MG TABS tablet, Take 1 tablet (90 mg total) by mouth 2 (two) times daily., Disp: 180 tablet, Rfl: 3  Past Medical History: Past Medical History:  Diagnosis Date  . Anxiety   . CAD (coronary artery disease)    a. 08/2017 NSTEMI/PCI: LM nl, LAD 30ost, 40m D1 50ost, RI nl, LCX small/nl, RCA 70p/[id number] [place]), EF 65%.  . History of echocardiogram    a. 08/2017 Echo: EF 65-70%, no rwma, mildly dil LA.  . Marland Kitchenyperlipidemia   . Hypertension   . Tobacco abuse     Tobacco Use: Social History   Tobacco Use  Smoking Status Current Every  Day Smoker  . Packs/day: 0.50  . Years: 20.00  . 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   Exercise Prescription Goal: Initial exercise prescription builds to 30-45 minutes a day of aerobic activity, 2-3 days per week.  Home exercise guidelines will be given to patient during program as part of exercise prescription that the participant will acknowledge.  Activity Barriers & Risk Stratification: Activity Barriers & Cardiac Risk Stratification - 09/28/17 1210      Activity Barriers & Cardiac Risk Stratification   Activity Barriers  Deconditioning    Cardiac Risk Stratification  Moderate       6 Minute Walk: 6 Minute Walk    Row Name 09/28/17 1232         6 Minute Walk   Phase  Initial     Distance  1720 feet  Walk Time  6 minutes     # of Rest Breaks  0     MPH  3.26     METS  5.44     RPE  8     VO2 Peak  19.04     Symptoms  No     Resting HR  53 bpm     Resting BP  122/70     Resting Oxygen Saturation   97 %     Exercise Oxygen Saturation  during 6 min walk  99 %     Max Ex. HR  109 bpm     Max Ex. BP  146/64     2 Minute Post BP  124/70        Oxygen Initial Assessment:   Oxygen Re-Evaluation:   Oxygen Discharge (Final Oxygen Re-Evaluation):   Initial Exercise Prescription: Initial Exercise Prescription - 09/28/17 1200      Date of Initial Exercise RX and Referring Provider   Date  09/28/17    Referring Provider  End, [name] MD      Treadmill   MPH  3.5    Grade  3.5    Minutes  15     METs  5.37      Elliptical   Level  2    Speed  5.8    Minutes  15      T5 Nustep   Level  5    SPM  100    Minutes  15    METs  4      Prescription Details   Frequency (times per week)  3    Duration  Progress to 45 minutes of aerobic exercise without signs/symptoms of physical distress      Intensity   THRR 40-80% of Max Heartrate  102-151    Ratings of Perceived Exertion  11-13    Perceived Dyspnea  0-4      Progression   Progression  Continue to progress workloads to maintain intensity without signs/symptoms of physical distress.      Resistance Training   Training Prescription  Yes    Weight  5 lbs    Reps  10-15       Perform Capillary Blood Glucose checks as needed.  Exercise Prescription Changes: Exercise Prescription Changes    Row Name 09/28/17 1200             Response to Exercise   Blood Pressure (Admit)  122/70       Blood Pressure (Exercise)  146/64       Blood Pressure (Exit)  124/70       Heart Rate (Admit)  53 bpm       Heart Rate (Exercise)  109 bpm       Heart Rate (Exit)  61 bpm       Oxygen Saturation (Admit)  97 %       Oxygen Saturation (Exercise)  99 %       Rating of Perceived Exertion (Exercise)  8       Symptoms  none       Comments  walk test results          Exercise Comments:   Exercise Goals and Review: Exercise Goals    Row Name 09/28/17 1235             Exercise Goals   Increase Physical Activity  Yes       Intervention  Provide advice, education, support and  counseling about physical activity/exercise needs.;Develop an individualized exercise prescription for aerobic and resistive training based on initial evaluation findings, risk stratification, comorbidities and participant's personal goals.       Expected Outcomes  Short Term: Attend rehab on a regular basis to increase amount of physical activity.;Long Term: Add in home exercise to make exercise part of routine and to increase amount of physical  activity.;Long Term: Exercising regularly at least 3-5 days a week.       Increase Strength and Stamina  Yes       Intervention  Provide advice, education, support and counseling about physical activity/exercise needs.;Develop an individualized exercise prescription for aerobic and resistive training based on initial evaluation findings, risk stratification, comorbidities and participant's personal goals.       Expected Outcomes  Short Term: Increase workloads from initial exercise prescription for resistance, speed, and METs.;Short Term: Perform resistance training exercises routinely during rehab and add in resistance training at home;Long Term: Improve cardiorespiratory fitness, muscular endurance and strength as measured by increased METs and functional capacity (6MWT)       Able to understand and use rate of perceived exertion (RPE) scale  Yes       Intervention  Provide education and explanation on how to use RPE scale       Expected Outcomes  Short Term: Able to use RPE daily in rehab to express subjective intensity level;Long Term:  Able to use RPE to guide intensity level when exercising independently       Knowledge and understanding of Target Heart Rate Range (THRR)  Yes       Intervention  Provide education and explanation of THRR including how the numbers were predicted and where they are located for reference       Expected Outcomes  Short Term: Able to state/look up THRR;Short Term: Able to use daily as guideline for intensity in rehab;Long Term: Able to use THRR to govern intensity when exercising independently       Able to check pulse independently  Yes       Intervention  Provide education and demonstration on how to check pulse in carotid and radial arteries.;Review the importance of being able to check your own pulse for safety during independent exercise       Expected Outcomes  Short Term: Able to explain why pulse checking is important during independent exercise;Long Term: Able  to check pulse independently and accurately       Understanding of Exercise Prescription  Yes       Intervention  Provide education, explanation, and written materials on patient's individual exercise prescription       Expected Outcomes  Short Term: Able to explain program exercise prescription;Long Term: Able to explain home exercise prescription to exercise independently          Exercise Goals Re-Evaluation :   Discharge Exercise Prescription (Final Exercise Prescription Changes): Exercise Prescription Changes - 09/28/17 1200      Response to Exercise   Blood Pressure (Admit)  122/70    Blood Pressure (Exercise)  146/64    Blood Pressure (Exit)  124/70    Heart Rate (Admit)  53 bpm    Heart Rate (Exercise)  109 bpm    Heart Rate (Exit)  61 bpm    Oxygen Saturation (Admit)  97 %    Oxygen Saturation (Exercise)  99 %    Rating of Perceived Exertion (Exercise)  8    Symptoms  none    Comments  walk test results       Nutrition:  Target Goals: Understanding of nutrition guidelines, daily intake of sodium <1571m, cholesterol <20106m calories 30% from fat and 7% or less from saturated fats, daily to have 5 or more servings of fruits and vegetables.  Biometrics: Pre Biometrics - 09/28/17 1236      Pre Biometrics   Height  5' 9.7" (1.77 m)    Weight  174 lb 12.8 oz (79.3 kg)    Waist Circumference  34 inches    Hip Circumference  36 inches    Waist to Hip Ratio  0.94 %    BMI (Calculated)  25.31    Single Leg Stand  30 seconds        Nutrition Therapy Plan and Nutrition Goals:   Nutrition Assessments: Nutrition Assessments - 09/28/17 1046      MEDFICTS Scores   Pre Score  11       Nutrition Goals Re-Evaluation:   Nutrition Goals Discharge (Final Nutrition Goals Re-Evaluation):   Psychosocial: Target Goals: Acknowledge presence or absence of significant depression and/or stress, maximize coping skills, provide positive support system. Participant is able to  verbalize types and ability to use techniques and skills needed for reducing stress and depression.   Initial Review & Psychosocial Screening: Initial Psych Review & Screening - 09/28/17 1207      Initial Review   Current issues with  Current Stress Concerns;Current Sleep Concerns He has a hard time getting to bed due to no set routine    Source of Stress Concerns  Occupation    Comments  KeLanny Hursteports a having a stressful job, but he is "used to it" after 10 years. His boss has been very accommodating with his heart attack.       Family Dynamics   Good Support System?  Yes family      Barriers   Psychosocial barriers to participate in program  There are no identifiable barriers or psychosocial needs.;The patient should benefit from training in stress management and relaxation.      Screening Interventions   Interventions  Encouraged to exercise;Provide feedback about the scores to participant;Program counselor consult;To provide support and resources with identified psychosocial needs    Expected Outcomes  Short Term goal: Utilizing psychosocial counselor, staff and physician to assist with identification of specific Stressors or current issues interfering with healing process. Setting desired goal for each stressor or current issue identified.;Long Term Goal: Stressors or current issues are controlled or eliminated.;Short Term goal: Identification and review with participant of any Quality of Life or Depression concerns found by scoring the questionnaire.;Long Term goal: The participant improves quality of Life and PHQ9 Scores as seen by post scores and/or verbalization of changes       Quality of Life Scores:  Quality of Life - 09/28/17 1046      Quality of Life Scores   Health/Function Pre  28.9 %    Socioeconomic Pre  30 %    Psych/Spiritual Pre  30 %    Family Pre  30 %    GLOBAL Pre  29.51 %      Scores of 19 and below usually indicate a poorer quality of life in these areas.   A difference of  2-3 points is a clinically meaningful difference.  A difference of 2-3 points in the total score of the Quality of Life Index has been associated with significant improvement in overall quality of life, self-image, physical symptoms, and general health  in studies assessing change in quality of life.  PHQ-9: Recent Review Flowsheet Data    Depression screen [facility] 2/9 09/28/2017 11/11/2016 05/28/2016   Decreased Interest 0 1 0   Down, Depressed, Hopeless 0 1 0   PHQ - 2 Score 0 2 0   Altered sleeping 1 0 -   Tired, decreased energy 0 2 -   Change in appetite 1 0 -   Feeling bad or failure about yourself  0 0 -   Trouble concentrating 0 0 -   Moving slowly or fidgety/restless 0 0 -   Suicidal thoughts 0 0 -   PHQ-9 Score 2 4 -   Difficult doing work/chores Not difficult at all Somewhat difficult  -     Interpretation of Total Score  Total Score Depression Severity:  1-4 = Minimal depression, 5-9 = Mild depression, 10-14 = Moderate depression, 15-19 = Moderately severe depression, 20-27 = Severe depression   Psychosocial Evaluation and Intervention:   Psychosocial Re-Evaluation:   Psychosocial Discharge (Final Psychosocial Re-Evaluation):   Vocational Rehabilitation: Provide vocational rehab assistance to qualifying candidates.   Vocational Rehab Evaluation & Intervention: Vocational Rehab - 09/28/17 1209      Initial Vocational Rehab Evaluation & Intervention   Assessment shows need for Vocational Rehabilitation  No       Education: Education Goals: Education classes will be provided on a variety of topics geared toward better understanding of heart health and risk factor modification. Participant will state understanding/return demonstration of topics presented as noted by education test scores.  Learning Barriers/Preferences: Learning Barriers/Preferences - 09/28/17 1209      Learning Barriers/Preferences   Learning Barriers  None    Learning Preferences   None       Education Topics:  AED/CPR: - Group verbal and written instruction with the use of models to demonstrate the basic use of the AED with the basic ABC's of resuscitation.   General Nutrition Guidelines/Fats and Fiber: -Group instruction provided by verbal, written material, models and posters to present the general guidelines for heart healthy nutrition. Gives an explanation and review of dietary fats and fiber.   Controlling Sodium/Reading Food Labels: -Group verbal and written material supporting the discussion of sodium use in heart healthy nutrition. Review and explanation with models, verbal and written materials for utilization of the food label.   Exercise Physiology & General Exercise Guidelines: - Group verbal and written instruction with models to review the exercise physiology of the cardiovascular system and associated critical values. Provides general exercise guidelines with specific guidelines to those with heart or lung disease.    Aerobic Exercise & Resistance Training: - Gives group verbal and written instruction on the various components of exercise. Focuses on aerobic and resistive training programs and the benefits of this training and how to safely progress through these programs..   Flexibility, Balance, Mind/Body Relaxation: Provides group verbal/written instruction on the benefits of flexibility and balance training, including mind/body exercise modes such as yoga, pilates and tai chi.  Demonstration and skill practice provided.   Stress and Anxiety: - Provides group verbal and written instruction about the health risks of elevated stress and causes of high stress.  Discuss the correlation between heart/lung disease and anxiety and treatment options. Review healthy ways to manage with stress and anxiety.   Depression: - Provides group verbal and written instruction on the correlation between heart/lung disease and depressed mood, treatment options,  and the stigmas associated with seeking treatment.   Anatomy &  Physiology of the Heart: - Group verbal and written instruction and models provide basic cardiac anatomy and physiology, with the coronary electrical and arterial systems. Review of Valvular disease and Heart Failure   Cardiac Procedures: - Group verbal and written instruction to review commonly prescribed medications for heart disease. Reviews the medication, class of the drug, and side effects. Includes the steps to properly store meds and maintain the prescription regimen. (beta blockers and nitrates)   Cardiac Medications I: - Group verbal and written instruction to review commonly prescribed medications for heart disease. Reviews the medication, class of the drug, and side effects. Includes the steps to properly store meds and maintain the prescription regimen.   Cardiac Medications II: -Group verbal and written instruction to review commonly prescribed medications for heart disease. Reviews the medication, class of the drug, and side effects. (all other drug classes)    Go Sex-Intimacy & Heart Disease, Get SMART - Goal Setting: - Group verbal and written instruction through game format to discuss heart disease and the return to sexual intimacy. Provides group verbal and written material to discuss and apply goal setting through the application of the S.M.A.R.T. Method.   Other Matters of the Heart: - Provides group verbal, written materials and models to describe Stable Angina and Peripheral Artery. Includes description of the disease process and treatment options available to the cardiac patient.   Exercise & Equipment Safety: - Individual verbal instruction and demonstration of equipment use and safety with use of the equipment.   Cardiac Rehab from 09/28/2017 in [facility] Cardiac and Pulmonary Rehab  Date  09/28/17  Educator  [facility]  Instruction Review Code  1- Verbalizes Understanding      Infection Prevention: -  Provides verbal and written material to individual with discussion of infection control including proper hand washing and proper equipment cleaning during exercise session.   Cardiac Rehab from 09/28/2017 in [facility] Cardiac and Pulmonary Rehab  Date  09/28/17  Educator  [facility]  Instruction Review Code  1- Verbalizes Understanding      Falls Prevention: - Provides verbal and written material to individual with discussion of falls prevention and safety.   Cardiac Rehab from 09/28/2017 in [facility] Cardiac and Pulmonary Rehab  Date  09/28/17  Educator  [facility]  Instruction Review Code  1- Verbalizes Understanding      Diabetes: - Individual verbal and written instruction to review signs/symptoms of diabetes, desired ranges of glucose level fasting, after meals and with exercise. Acknowledge that pre and post exercise glucose checks will be done for 3 sessions at entry of program.   Know Your Numbers and Risk Factors: -Group verbal and written instruction about important numbers in your health.  Discussion of what are risk factors and how they play a role in the disease process.  Review of Cholesterol, Blood Pressure, Diabetes, and BMI and the role they play in your overall health.   Sleep Hygiene: -Provides group verbal and written instruction about how sleep can affect your health.  Define sleep hygiene, discuss sleep cycles and impact of sleep habits. Review good sleep hygiene tips.    Other: -Provides group and verbal instruction on various topics (see comments)   Knowledge Questionnaire Score: Knowledge Questionnaire Score - 09/28/17 1046      Knowledge Questionnaire Score   Pre Score  24/26 Reviewed results with pt today.  Education focuses: Nutrition and Exercise       Core Components/Risk Factors/Patient Goals at Admission: Personal Goals and Risk Factors at Admission -  09/28/17 1204      Core Components/Risk Factors/Patient Goals on Admission    Weight Management  Yes;Weight Loss     Intervention  Weight Management: Develop a combined nutrition and exercise program designed to reach desired caloric intake, while maintaining appropriate intake of nutrient and fiber, sodium and fats, and appropriate energy expenditure required for the weight goal.;Weight Management: Provide education and appropriate resources to help participant work on and attain dietary goals.;Weight Management/Obesity: Establish reasonable short term and long term weight goals.    Admit Weight  175 lb (79.4 kg)    Goal Weight: Short Term  170 lb (77.1 kg)    Goal Weight: Long Term  170 lb (77.1 kg)    Expected Outcomes  Short Term: Continue to assess and modify interventions until short term weight is achieved;Long Term: Adherence to nutrition and physical activity/exercise program aimed toward attainment of established weight goal;Weight Loss: Understanding of general recommendations for a balanced deficit meal plan, which promotes 1-2 lb weight loss per week and includes a negative energy balance of [phone] kcal/d;Understanding recommendations for meals to include 15-35% energy as protein, 25-35% energy from fat, 35-60% energy from carbohydrates, less than 245m of dietary cholesterol, 20-35 gm of total fiber daily;Understanding of distribution of calorie intake throughout the day with the consumption of 4-5 meals/snacks    Tobacco Cessation  Yes Quit [place] information given    Number of packs per day  3/4 a pack    Intervention  Assist the participant in steps to quit. Provide individualized education and counseling about committing to Tobacco Cessation, relapse prevention, and pharmacological support that can be provided by physician.;OAdvice worker assist with locating and accessing local/national Quit Smoking programs, and support quit date choice.    Expected Outcomes  Short Term: Will demonstrate readiness to quit, by selecting a quit date.;Long Term: Complete abstinence from all tobacco products  for at least 12 months from quit date.;Short Term: Will quit all tobacco product use, adhering to prevention of relapse plan.    Hypertension  Yes    Intervention  Provide education on lifestyle modifcations including regular physical activity/exercise, weight management, moderate sodium restriction and increased consumption of fresh fruit, vegetables, and low fat dairy, alcohol moderation, and smoking cessation.;Monitor prescription use compliance.    Expected Outcomes  Short Term: Continued assessment and intervention until BP is < 140/913mHG in hypertensive participants. < 130/8013mG in hypertensive participants with diabetes, heart failure or chronic kidney disease.;Long Term: Maintenance of blood pressure at goal levels.    Lipids  Yes    Intervention  Provide education and support for participant on nutrition & aerobic/resistive exercise along with prescribed medications to achieve LDL <28m30mDL >40mg87m Expected Outcomes  Short Term: Participant states understanding of desired cholesterol values and is compliant with medications prescribed. Participant is following exercise prescription and nutrition guidelines.;Long Term: Cholesterol controlled with medications as prescribed, with individualized exercise RX and with personalized nutrition plan. Value goals: LDL < 28mg,44m > 40 mg.       Core Components/Risk Factors/Patient Goals Review:    Core Components/Risk Factors/Patient Goals at Discharge (Final Review):    ITP Comments: ITP Comments    Row Name 09/28/17 1202           ITP Comments  Med Review completed. Initial ITP created. Diagnosis can be found in [facility] 4/[facility]9          Comments: Initial ITP

## 2017-09-28 NOTE — Patient Instructions (Signed)
Patient Instructions  Patient Details  Name: Nathan Gray MRN: [id number] Date of Birth: 11/08/1970 Referring Provider:  Yvonne Kendall, MD  Below are your personal goals for exercise, nutrition, and risk factors. Our goal is to help you stay on track towards obtaining and maintaining these goals. We will be discussing your progress on these goals with you throughout the program.  Initial Exercise Prescription: Initial Exercise Prescription - 09/28/17 1200      Date of Initial Exercise RX and Referring Provider   Date  09/28/17    Referring Provider  End, Cristal Deer MD      Treadmill   MPH  3.5    Grade  3.5    Minutes  15    METs  5.37      Elliptical   Level  2    Speed  5.8    Minutes  15      T5 Nustep   Level  5    SPM  100    Minutes  15    METs  4      Prescription Details   Frequency (times per week)  3    Duration  Progress to 45 minutes of aerobic exercise without signs/symptoms of physical distress      Intensity   THRR 40-80% of Max Heartrate  102-151    Ratings of Perceived Exertion  11-13    Perceived Dyspnea  0-4      Progression   Progression  Continue to progress workloads to maintain intensity without signs/symptoms of physical distress.      Resistance Training   Training Prescription  Yes    Weight  5 lbs    Reps  10-15       Exercise Goals: Frequency: Be able to perform aerobic exercise two to three times per week in program working toward 2-5 days per week of home exercise.  Intensity: Work with a perceived exertion of 11 (fairly light) - 15 (hard) while following your exercise prescription.  We will make changes to your prescription with you as you progress through the program.   Duration: Be able to do 30 to 45 minutes of continuous aerobic exercise in addition to a 5 minute warm-up and a 5 minute cool-down routine.   Nutrition Goals: Your personal nutrition goals will be established when you do your nutrition analysis with  the dietician.  The following are general nutrition guidelines to follow: Cholesterol < /day Sodium < /day Fiber: Men under 50 yrs - 38 grams per day  Personal Goals: Personal Goals and Risk Factors at Admission - 09/28/17 1204      Core Components/Risk Factors/Patient Goals on Admission    Weight Management  Yes;Weight Loss    Intervention  Weight Management: Develop a combined nutrition and exercise program designed to reach desired caloric intake, while maintaining appropriate intake of nutrient and fiber, sodium and fats, and appropriate energy expenditure required for the weight goal.;Weight Management: Provide education and appropriate resources to help participant work on and attain dietary goals.;Weight Management/Obesity: Establish reasonable short term and long term weight goals.    Admit Weight  175 lb (79.4 kg)    Goal Weight: Short Term  170 lb (77.1 kg)    Goal Weight: Long Term  170 lb (77.1 kg)    Expected Outcomes  Short Term: Continue to assess and modify interventions until short term weight is achieved;Long Term: Adherence to nutrition and physical activity/exercise program aimed toward attainment of established weight goal;Weight  Loss: Understanding of general recommendations for a balanced deficit meal plan, which promotes 1-2 lb weight loss per week and includes a negative energy balance of [id number] kcal/d;Understanding recommendations for meals to include 15-35% energy as protein, 25-35% energy from fat, 35-60% energy from carbohydrates, less than  of dietary cholesterol, 20-35 gm of total fiber daily;Understanding of distribution of calorie intake throughout the day with the consumption of 4-5 meals/snacks    Tobacco Cessation  Yes Quit [place] information given    Number of packs per day  3/4 a pack    Intervention  Assist the participant in steps to quit. Provide individualized education and counseling about committing to Tobacco Cessation, relapse prevention,  and pharmacological support that can be provided by physician.;Education officer, environmental, assist with locating and accessing local/national Quit Smoking programs, and support quit date choice.    Expected Outcomes  Short Term: Will demonstrate readiness to quit, by selecting a quit date.;Long Term: Complete abstinence from all tobacco products for at least 12 months from quit date.;Short Term: Will quit all tobacco product use, adhering to prevention of relapse plan.    Hypertension  Yes    Intervention  Provide education on lifestyle modifcations including regular physical activity/exercise, weight management, moderate sodium restriction and increased consumption of fresh fruit, vegetables, and low fat dairy, alcohol moderation, and smoking cessation.;Monitor prescription use compliance.    Expected Outcomes  Short Term: Continued assessment and intervention until BP is < 140/24mm HG in hypertensive participants. < 130/68mm HG in hypertensive participants with diabetes, heart failure or chronic kidney disease.;Long Term: Maintenance of blood pressure at goal levels.    Lipids  Yes    Intervention  Provide education and support for participant on nutrition & aerobic/resistive exercise along with prescribed medications to achieve LDL 70mg , HDL >40mg .    Expected Outcomes  Short Term: Participant states understanding of desired cholesterol values and is compliant with medications prescribed. Participant is following exercise prescription and nutrition guidelines.;Long Term: Cholesterol controlled with medications as prescribed, with individualized exercise RX and with personalized nutrition plan. Value goals: LDL < , HDL > 40 mg.       Tobacco Use Initial Evaluation: Social History   Tobacco Use  Smoking Status Current Every Day Smoker  . Packs/day: 0.50  . Years: 20.00  . Pack years: 10.00  Smokeless Tobacco Former Neurosurgeon    Exercise Goals and Review: Exercise Goals    Row Name 09/28/17  1235             Exercise Goals   Increase Physical Activity  Yes       Intervention  Provide advice, education, support and counseling about physical activity/exercise needs.;Develop an individualized exercise prescription for aerobic and resistive training based on initial evaluation findings, risk stratification, comorbidities and participant's personal goals.       Expected Outcomes  Short Term: Attend rehab on a regular basis to increase amount of physical activity.;Long Term: Add in home exercise to make exercise part of routine and to increase amount of physical activity.;Long Term: Exercising regularly at least 3-5 days a week.       Increase Strength and Stamina  Yes       Intervention  Provide advice, education, support and counseling about physical activity/exercise needs.;Develop an individualized exercise prescription for aerobic and resistive training based on initial evaluation findings, risk stratification, comorbidities and participant's personal goals.       Expected Outcomes  Short Term: Increase workloads from initial exercise prescription for  resistance, speed, and METs.;Short Term: Perform resistance training exercises routinely during rehab and add in resistance training at home;Long Term: Improve cardiorespiratory fitness, muscular endurance and strength as measured by increased METs and functional capacity ( )       Able to understand and use rate of perceived exertion (RPE) scale  Yes       Intervention  Provide education and explanation on how to use RPE scale       Expected Outcomes  Short Term: Able to use RPE daily in rehab to express subjective intensity level;Long Term:  Able to use RPE to guide intensity level when exercising independently       Knowledge and understanding of Target Heart Rate Range (THRR)  Yes       Intervention  Provide education and explanation of THRR including how the numbers were predicted and where they are located for reference        Expected Outcomes  Short Term: Able to state/look up THRR;Short Term: Able to use daily as guideline for intensity in rehab;Long Term: Able to use THRR to govern intensity when exercising independently       Able to check pulse independently  Yes       Intervention  Provide education and demonstration on how to check pulse in carotid and radial arteries.;Review the importance of being able to check your own pulse for safety during independent exercise       Expected Outcomes  Short Term: Able to explain why pulse checking is important during independent exercise;Long Term: Able to check pulse independently and accurately       Understanding of Exercise Prescription  Yes       Intervention  Provide education, explanation, and written materials on patient's individual exercise prescription       Expected Outcomes  Short Term: Able to explain program exercise prescription;Long Term: Able to explain home exercise prescription to exercise independently          Copy of goals given to participant.

## 2017-09-30 ENCOUNTER — Encounter: Payer: 59 | Admitting: *Deleted

## 2017-09-30 ENCOUNTER — Encounter: Payer: Self-pay | Admitting: *Deleted

## 2017-09-30 DIAGNOSIS — I214 Non-ST elevation (NSTEMI) myocardial infarction: Secondary | ICD-10-CM | POA: Diagnosis not present

## 2017-09-30 DIAGNOSIS — Z955 Presence of coronary angioplasty implant and graft: Secondary | ICD-10-CM

## 2017-09-30 NOTE — Progress Notes (Signed)
Daily Session Note  Patient Details  Name: Nathan Gray MRN: [id number] Date of Birth: [birth year] Referring Provider:     Cardiac Rehab from 09/28/2017 in [facility]  Referring Provider  End, [name] MD      Encounter Date: 09/30/2017  Check In: Session Check In - 09/30/17 0843      Check-In   Staff Present  Nyoka Cowden, RN, BSN, MA;Susanne Bice, RN, BSN, CCRP;Jeanet Lupe Luan Pulling, MA, [place], CCRP, Exercise Physiologist    Supervising physician immediately available to respond to emergencies  See telemetry face sheet for immediately available ER MD    Medication changes reported      No    Fall or balance concerns reported     No    Tobacco Cessation  Use Decreased 3 cigarettes in last 24 hours    Warm-up and Cool-down  Performed on first and last piece of equipment    Resistance Training Performed  Yes    VAD Patient?  No      Pain Assessment   Currently in Pain?  No/denies          Social History   Tobacco Use  Smoking Status Current Every Day Smoker  . Packs/day: 0.25  . Years: 20.00  . Pack years: 5.00  . Types: Cigarettes  Smokeless Tobacco Former User  Tobacco Comment   09/30/17 down to cigarettes in last 24 hrs    Goals Met:  Exercise tolerated well Personal goals reviewed No report of cardiac concerns or symptoms Strength training completed today  Goals Unmet:  Not Applicable  Comments: First full day of exercise!  Patient was oriented to gym and equipment including functions, settings, policies, and procedures.  Patient's individual exercise prescription and treatment plan were reviewed.  All starting workloads were established based on the results of the 6 minute walk test done at initial orientation visit.  The plan for exercise progression was also introduced and progression will be customized based on patient's performance and goals.    Dr. Emily Filbert is Medical Director for [place] and  [facility].

## 2017-10-05 ENCOUNTER — Encounter: Payer: 59 | Admitting: *Deleted

## 2017-10-05 DIAGNOSIS — I214 Non-ST elevation (NSTEMI) myocardial infarction: Secondary | ICD-10-CM | POA: Diagnosis not present

## 2017-10-05 DIAGNOSIS — Z955 Presence of coronary angioplasty implant and graft: Secondary | ICD-10-CM

## 2017-10-05 NOTE — Progress Notes (Signed)
Daily Session Note  Patient Details  Name: Nathan Gray MRN: [id number] Date of Birth: May 19, 1971 Referring Provider:     Cardiac Rehab from 09/28/2017 in [facility]  Referring Provider  End, [name] MD      Encounter Date: 10/05/2017  Check In: Session Check In - 10/05/17 0834      Check-In   Location  [facility]-Cardiac & Pulmonary Rehab    Staff Present  Alberteen Sam, MA, RCEP, CCRP, Exercise Physiologist;Maitri Schnoebelen Amedeo Plenty, BS, ACSM CEP, Exercise Physiologist;Susanne Bice, RN, BSN, CCRP    Supervising physician immediately available to respond to emergencies  See telemetry face sheet for immediately available ER MD    Medication changes reported      No    Fall or balance concerns reported     No    Tobacco Cessation  No Change    Warm-up and Cool-down  Performed on first and last piece of equipment    Resistance Training Performed  Yes    VAD Patient?  No      Pain Assessment   Currently in Pain?  No/denies    Multiple Pain Sites  No          Social History   Tobacco Use  Smoking Status Current Every Day Smoker  . Packs/day: 0.25  . Years: 20.00  . Pack years: 5.00  . Types: Cigarettes  Smokeless Tobacco Former User  Tobacco Comment   09/30/17 down to cigarettes in last 24 hrs    Goals Met:  Independence with exercise equipment Exercise tolerated well No report of cardiac concerns or symptoms Strength training completed today  Goals Unmet:  Not Applicable  Comments: Pt able to follow exercise prescription today without complaint.  Will continue to monitor for progression. Reviewed home exercise with pt today.  Pt plans to walk at home and go to [employer] for exercise.  Reviewed THR, pulse, RPE, sign and symptoms, NTG use, and when to call 911 or MD.  Also discussed weather considerations and indoor options.  Pt voiced understanding.   Nathan Gray is interested in quitting smoking for good. He is down to 5 cigarettes in last 24  hours.  We talked some about it today and he is ready to quit.  Gave him quit smoking packet and fake cigarette to use/hold.  Also talked about combination therapy with gum that he is already using and patches. He would like to avoid using Chantix if possible.  We will talk about setting a quit date later this week as he tries the fake cigarette and look into using patches with gum.     Dr. Emily Filbert is Medical Director for [place] and [facility].

## 2017-10-07 ENCOUNTER — Encounter: Payer: 59 | Admitting: *Deleted

## 2017-10-07 ENCOUNTER — Encounter: Payer: Self-pay | Admitting: *Deleted

## 2017-10-07 DIAGNOSIS — I214 Non-ST elevation (NSTEMI) myocardial infarction: Secondary | ICD-10-CM

## 2017-10-07 DIAGNOSIS — Z955 Presence of coronary angioplasty implant and graft: Secondary | ICD-10-CM

## 2017-10-07 NOTE — Progress Notes (Signed)
Daily Session Note  Patient Details  Name: Nathan Gray MRN: [id number] Date of Birth: 1970-08-26 Referring Provider:     Cardiac Rehab from 09/28/2017 in [facility] Re[facility]  Referring Provider  End, [name] MD      Encounter Date: 10/07/2017  Check In: Session Check In - 10/07/17 0908      Check-In   Location  [facility]-Cardiac & Pulmonary Rehab    Staff Present  Heath Lark, RN, BSN, CCRP;Meredith Sherryll Burger, RN BSN;Zennie Ayars Luan Pulling, MA, RCEP, CCRP, Exercise Physiologist    Supervising physician immediately available to respond to emergencies  See telemetry face sheet for immediately available ER MD    Medication changes reported      No    Fall or balance concerns reported     No    Warm-up and Cool-down  Performed on first and last piece of equipment    Resistance Training Performed  Yes    VAD Patient?  No      Pain Assessment   Currently in Pain?  No/denies          Social History   Tobacco Use  Smoking Status Current Every Day Smoker  . Packs/day: 0.25  . Years: 20.00  . Pack years: 5.00  . Types: Cigarettes  Smokeless Tobacco Former User  Tobacco Comment   09/30/17 down to cigarettes in last 24 hrs    Goals Met:  Independence with exercise equipment Exercise tolerated well No report of cardiac concerns or symptoms Strength training completed today  Goals Unmet:  Not Applicable  Comments: Pt able to follow exercise prescription today without complaint.  Will continue to monitor for progression.    Dr. Emily Filbert is Medical Director for [place] and [facility].

## 2017-10-07 NOTE — Progress Notes (Signed)
Cardiac Individual Treatment Plan  Patient Details  Name: Nathan Gray MRN: [id number] Date of Birth: 23-Jul-1970 Referring Provider:     Cardiac Rehab from 09/28/2017 in [facility] Cardiac and Pulmonary Rehab  Referring Provider  End, [name] MD      Initial Encounter Date:    Cardiac Rehab from 09/28/2017 in [facility] Cardiac and Pulmonary Rehab  Date  09/28/17  Referring Provider  End, [name] MD      Visit Diagnosis: NSTEMI (non-ST elevated myocardial infarction) [facility])  Status post coronary artery stent placement  Patient's Home Medications on Admission:  Current Outpatient Medications:  .  aspirin 81 MG chewable tablet, Chew 1 tablet (81 mg total) by mouth daily., Disp: 30 tablet, Rfl: 2 .  atorvastatin (LIPITOR) 80 MG tablet, Take 1 tablet (80 mg total) by mouth daily at 6 PM., Disp: 90 tablet, Rfl: 3 .  carvedilol (COREG) 6.25 MG tablet, Take 1 tablet (6.25 mg total) by mouth 2 (two) times daily with a meal., Disp: 180 tablet, Rfl: 3 .  citalopram (CELEXA) 20 MG tablet, TAKE 1 TABLET (20 MG TOTAL) BY MOUTH DAILY., Disp: 90 tablet, Rfl: 3 .  losartan (COZAAR) 50 MG tablet, Take 1 tablet (50 mg total) by mouth daily., Disp: 90 tablet, Rfl: 3 .  nitroGLYCERIN (NITROSTAT) 0.4 MG SL tablet, Place 1 tablet (0.4 mg total) under the tongue every 5 (five) minutes as needed for chest pain., Disp: 20 tablet, Rfl: 2 .  ticagrelor (BRILINTA) 90 MG TABS tablet, Take 1 tablet (90 mg total) by mouth 2 (two) times daily., Disp: 180 tablet, Rfl: 3  Past Medical History: Past Medical History:  Diagnosis Date  . Anxiety   . CAD (coronary artery disease)    a. 08/2017 NSTEMI/PCI: LM nl, LAD 30ost, 28m D1 50ost, RI nl, LCX small/nl, RCA 70p/[id number] [place]), EF 65%.  . History of echocardiogram    a. 08/2017 Echo: EF 65-70%, no rwma, mildly dil LA.  . Marland Kitchenyperlipidemia   . Hypertension   . Tobacco abuse     Tobacco Use: Social History   Tobacco Use  Smoking Status Current Every  Day Smoker  . Packs/day: 0.25  . Years: 20.00  . Pack years: 5.00  . Types: Cigarettes  Smokeless Tobacco Former User  Tobacco Comment   09/30/17 down to cigarettes in last 24 hrs    Labs: Recent Review Flowsheet Data    Labs for ITP Cardiac and Pulmonary Rehab Latest Ref Rng & Units 08/21/2017   Cholestrol 0 - 200 mg/dL 192   LDLCALC 0 - 99 mg/dL 94   HDL >40 mg/dL 47   Trlycerides <150 mg/dL 254(H)       Exercise Target Goals:    Exercise Program Goal: Individual exercise prescription set using results from initial 6 min walk test and THRR while considering  patient's activity barriers and safety.   Exercise Prescription Goal: Initial exercise prescription builds to 30-45 minutes a day of aerobic activity, 2-3 days per week.  Home exercise guidelines will be given to patient during program as part of exercise prescription that the participant will acknowledge.  Activity Barriers & Risk Stratification: Activity Barriers & Cardiac Risk Stratification - 09/28/17 1210      Activity Barriers & Cardiac Risk Stratification   Activity Barriers  Deconditioning    Cardiac Risk Stratification  Moderate       6 Minute Walk: 6 Minute Walk    Row Name 09/28/17 1232  6 Minute Walk   Phase  Initial     Distance  1720 feet     Walk Time  6 minutes     # of Rest Breaks  0     MPH  3.26     METS  5.44     RPE  8     VO2 Peak  19.04     Symptoms  No     Resting HR  53 bpm     Resting BP  122/70     Resting Oxygen Saturation   97 %     Exercise Oxygen Saturation  during 6 min walk  99 %     Max Ex. HR  109 bpm     Max Ex. BP  146/64     2 Minute Post BP  124/70        Oxygen Initial Assessment:   Oxygen Re-Evaluation:   Oxygen Discharge (Final Oxygen Re-Evaluation):   Initial Exercise Prescription: Initial Exercise Prescription - 09/28/17 1200      Date of Initial Exercise RX and Referring Provider   Date  09/28/17    Referring Provider  End,  [name] MD      Treadmill   MPH  3.5    Grade  3.5    Minutes  15    METs  5.37      Elliptical   Level  2    Speed  5.8    Minutes  15      T5 Nustep   Level  5    SPM  100    Minutes  15    METs  4      Prescription Details   Frequency (times per week)  3    Duration  Progress to 45 minutes of aerobic exercise without signs/symptoms of physical distress      Intensity   THRR 40-80% of Max Heartrate  102-151    Ratings of Perceived Exertion  11-13    Perceived Dyspnea  0-4      Progression   Progression  Continue to progress workloads to maintain intensity without signs/symptoms of physical distress.      Resistance Training   Training Prescription  Yes    Weight  5 lbs    Reps  10-15       Perform Capillary Blood Glucose checks as needed.  Exercise Prescription Changes: Exercise Prescription Changes    Row Name 09/28/17 1200 10/05/17 0800           Response to Exercise   Blood Pressure (Admit)  122/70  -      Blood Pressure (Exercise)  146/64  -      Blood Pressure (Exit)  124/70  -      Heart Rate (Admit)  53 bpm  -      Heart Rate (Exercise)  109 bpm  -      Heart Rate (Exit)  61 bpm  -      Oxygen Saturation (Admit)  97 %  -      Oxygen Saturation (Exercise)  99 %  -      Rating of Perceived Exertion (Exercise)  8  -      Symptoms  none  -      Comments  walk test results  -        Home Exercise Plan   Plans to continue exercise at  [facility] (comment) walking and [employer]  Frequency  -  Add 3 additional days to program exercise sessions.      Initial Home Exercises Provided  -  10/05/17         Exercise Comments: Exercise Comments    Row Name 09/30/17 0845           Exercise Comments  First full day of exercise!  Patient was oriented to gym and equipment including functions, settings, policies, and procedures.  Patient's individual exercise prescription and treatment plan were reviewed.  All starting  workloads were established based on the results of the 6 minute walk test done at initial orientation visit.  The plan for exercise progression was also introduced and progression will be customized based on patient's performance and goals.          Exercise Goals and Review: Exercise Goals    Row Name 09/28/17 1235             Exercise Goals   Increase Physical Activity  Yes       Intervention  Provide advice, education, support and counseling about physical activity/exercise needs.;Develop an individualized exercise prescription for aerobic and resistive training based on initial evaluation findings, risk stratification, comorbidities and participant's personal goals.       Expected Outcomes  Short Term: Attend rehab on a regular basis to increase amount of physical activity.;Long Term: Add in home exercise to make exercise part of routine and to increase amount of physical activity.;Long Term: Exercising regularly at least 3-5 days a week.       Increase Strength and Stamina  Yes       Intervention  Provide advice, education, support and counseling about physical activity/exercise needs.;Develop an individualized exercise prescription for aerobic and resistive training based on initial evaluation findings, risk stratification, comorbidities and participant's personal goals.       Expected Outcomes  Short Term: Increase workloads from initial exercise prescription for resistance, speed, and METs.;Short Term: Perform resistance training exercises routinely during rehab and add in resistance training at home;Long Term: Improve cardiorespiratory fitness, muscular endurance and strength as measured by increased METs and functional capacity (6MWT)       Able to understand and use rate of perceived exertion (RPE) scale  Yes       Intervention  Provide education and explanation on how to use RPE scale       Expected Outcomes  Short Term: Able to use RPE daily in rehab to express subjective intensity  level;Long Term:  Able to use RPE to guide intensity level when exercising independently       Knowledge and understanding of Target Heart Rate Range (THRR)  Yes       Intervention  Provide education and explanation of THRR including how the numbers were predicted and where they are located for reference       Expected Outcomes  Short Term: Able to state/look up THRR;Short Term: Able to use daily as guideline for intensity in rehab;Long Term: Able to use THRR to govern intensity when exercising independently       Able to check pulse independently  Yes       Intervention  Provide education and demonstration on how to check pulse in carotid and radial arteries.;Review the importance of being able to check your own pulse for safety during independent exercise       Expected Outcomes  Short Term: Able to explain why pulse checking is important during independent exercise;Long Term: Able to check pulse independently and  accurately       Understanding of Exercise Prescription  Yes       Intervention  Provide education, explanation, and written materials on patient's individual exercise prescription       Expected Outcomes  Short Term: Able to explain program exercise prescription;Long Term: Able to explain home exercise prescription to exercise independently          Exercise Goals Re-Evaluation : Exercise Goals Re-Evaluation    Row Name 09/30/17 0845 10/05/17 0853           Exercise Goal Re-Evaluation   Exercise Goals Review  Increase Physical Activity;Able to understand and use rate of perceived exertion (RPE) scale;Knowledge and understanding of Target Heart Rate Range (THRR);Understanding of Exercise Prescription  Increase Physical Activity;Able to understand and use rate of perceived exertion (RPE) scale;Knowledge and understanding of Target Heart Rate Range (THRR);Increase Strength and Stamina;Able to check pulse independently;Understanding of Exercise Prescription      Comments  Reviewed RPE  scale, THR and program prescription with pt today.  Pt voiced understanding and was given a copy of goals to take home.   Reviewed home exercise with pt today.  Pt plans to walk at home and go to [employer] for exercise.  Reviewed THR, pulse, RPE, sign and symptoms, NTG use, and when to call 911 or MD.  Also discussed weather considerations and indoor options.  Pt voiced understanding.      Expected Outcomes  Short: Use RPE daily to regulate intensity.  Long: Follow program prescription in THR.  Short: Return to gym 2-3 days a week.  Long: Continue to exercise independently.          Discharge Exercise Prescription (Final Exercise Prescription Changes): Exercise Prescription Changes - 10/05/17 0800      Home Exercise Plan   Plans to continue exercise at  [facility] (comment) walking and [employer]    Frequency  Add 3 additional days to program exercise sessions.    Initial Home Exercises Provided  10/05/17       Nutrition:  Target Goals: Understanding of nutrition guidelines, daily intake of sodium '1500mg'$ , cholesterol '200mg'$ , calories 30% from fat and 7% or less from saturated fats, daily to have 5 or more servings of fruits and vegetables.  Biometrics: Pre Biometrics - 09/28/17 1236      Pre Biometrics   Height  5' 9.7" (1.77 m)    Weight  174 lb 12.8 oz (79.3 kg)    Waist Circumference  34 inches    Hip Circumference  36 inches    Waist to Hip Ratio  0.94 %    BMI (Calculated)  25.31    Single Leg Stand  30 seconds        Nutrition Therapy Plan and Nutrition Goals:   Nutrition Assessments: Nutrition Assessments - 09/28/17 1046      MEDFICTS Scores   Pre Score  11       Nutrition Goals Re-Evaluation:   Nutrition Goals Discharge (Final Nutrition Goals Re-Evaluation):   Psychosocial: Target Goals: Acknowledge presence or absence of significant depression and/or stress, maximize coping skills, provide positive support system. Participant is able  to verbalize types and ability to use techniques and skills needed for reducing stress and depression.   Initial Review & Psychosocial Screening: Initial Psych Review & Screening - 09/28/17 1207      Initial Review   Current issues with  Current Stress Concerns;Current Sleep Concerns He has a hard time getting to bed due  to no set routine    Source of Stress Concerns  Occupation    Comments  Nathan Gray reports a having a stressful job, but he is "used to it" after 10 years. His boss has been very accommodating with his heart attack.       Family Dynamics   Good Support System?  Yes family      Barriers   Psychosocial barriers to participate in program  There are no identifiable barriers or psychosocial needs.;The patient should benefit from training in stress management and relaxation.      Screening Interventions   Interventions  Encouraged to exercise;Provide feedback about the scores to participant;Program counselor consult;To provide support and resources with identified psychosocial needs    Expected Outcomes  Short Term goal: Utilizing psychosocial counselor, staff and physician to assist with identification of specific Stressors or current issues interfering with healing process. Setting desired goal for each stressor or current issue identified.;Long Term Goal: Stressors or current issues are controlled or eliminated.;Short Term goal: Identification and review with participant of any Quality of Life or Depression concerns found by scoring the questionnaire.;Long Term goal: The participant improves quality of Life and PHQ9 Scores as seen by post scores and/or verbalization of changes       Quality of Life Scores:  Quality of Life - 09/28/17 1046      Quality of Life Scores   Health/Function Pre  28.9 %    Socioeconomic Pre  30 %    Psych/Spiritual Pre  30 %    Family Pre  30 %    GLOBAL Pre  29.51 %      Scores of 19 and below usually indicate a poorer quality of life in these  areas.  A difference of  2-3 points is a clinically meaningful difference.  A difference of 2-3 points in the total score of the Quality of Life Index has been associated with significant improvement in overall quality of life, self-image, physical symptoms, and general health in studies assessing change in quality of life.  PHQ-9: Recent Review Flowsheet Data    Depression screen [facility] 2/9 09/28/2017 11/11/2016 05/28/2016   Decreased Interest 0 1 0   Down, Depressed, Hopeless 0 1 0   PHQ - 2 Score 0 2 0   Altered sleeping 1 0 -   Tired, decreased energy 0 2 -   Change in appetite 1 0 -   Feeling bad or failure about yourself  0 0 -   Trouble concentrating 0 0 -   Moving slowly or fidgety/restless 0 0 -   Suicidal thoughts 0 0 -   PHQ-9 Score 2 4 -   Difficult doing work/chores Not difficult at all Somewhat difficult  -     Interpretation of Total Score  Total Score Depression Severity:  1-4 = Minimal depression, 5-9 = Mild depression, 10-14 = Moderate depression, 15-19 = Moderately severe depression, 20-27 = Severe depression   Psychosocial Evaluation and Intervention:   Psychosocial Re-Evaluation:   Psychosocial Discharge (Final Psychosocial Re-Evaluation):   Vocational Rehabilitation: Provide vocational rehab assistance to qualifying candidates.   Vocational Rehab Evaluation & Intervention: Vocational Rehab - 09/28/17 1209      Initial Vocational Rehab Evaluation & Intervention   Assessment shows need for Vocational Rehabilitation  No       Education: Education Goals: Education classes will be provided on a variety of topics geared toward better understanding of heart health and risk factor modification. Participant will state understanding/return demonstration  of topics presented as noted by education test scores.  Learning Barriers/Preferences: Learning Barriers/Preferences - 09/28/17 1209      Learning Barriers/Preferences   Learning Barriers  None    Learning  Preferences  None       Education Topics:  AED/CPR: - Group verbal and written instruction with the use of models to demonstrate the basic use of the AED with the basic ABC's of resuscitation.   General Nutrition Guidelines/Fats and Fiber: -Group instruction provided by verbal, written material, models and posters to present the general guidelines for heart healthy nutrition. Gives an explanation and review of dietary fats and fiber.   Cardiac Rehab from 10/05/2017 in [facility] Cardiac and Pulmonary Rehab  Date  10/05/17  Educator  CR  Instruction Review Code  1- Verbalizes Understanding      Controlling Sodium/Reading Food Labels: -Group verbal and written material supporting the discussion of sodium use in heart healthy nutrition. Review and explanation with models, verbal and written materials for utilization of the food label.   Exercise Physiology & General Exercise Guidelines: - Group verbal and written instruction with models to review the exercise physiology of the cardiovascular system and associated critical values. Provides general exercise guidelines with specific guidelines to those with heart or lung disease.    Aerobic Exercise & Resistance Training: - Gives group verbal and written instruction on the various components of exercise. Focuses on aerobic and resistive training programs and the benefits of this training and how to safely progress through these programs..   Flexibility, Balance, Mind/Body Relaxation: Provides group verbal/written instruction on the benefits of flexibility and balance training, including mind/body exercise modes such as yoga, pilates and tai chi.  Demonstration and skill practice provided.   Stress and Anxiety: - Provides group verbal and written instruction about the health risks of elevated stress and causes of high stress.  Discuss the correlation between heart/lung disease and anxiety and treatment options. Review healthy ways to manage  with stress and anxiety.   Depression: - Provides group verbal and written instruction on the correlation between heart/lung disease and depressed mood, treatment options, and the stigmas associated with seeking treatment.   Anatomy & Physiology of the Heart: - Group verbal and written instruction and models provide basic cardiac anatomy and physiology, with the coronary electrical and arterial systems. Review of Valvular disease and Heart Failure   Cardiac Procedures: - Group verbal and written instruction to review commonly prescribed medications for heart disease. Reviews the medication, class of the drug, and side effects. Includes the steps to properly store meds and maintain the prescription regimen. (beta blockers and nitrates)   Cardiac Medications I: - Group verbal and written instruction to review commonly prescribed medications for heart disease. Reviews the medication, class of the drug, and side effects. Includes the steps to properly store meds and maintain the prescription regimen.   Cardiac Medications II: -Group verbal and written instruction to review commonly prescribed medications for heart disease. Reviews the medication, class of the drug, and side effects. (all other drug classes)    Go Sex-Intimacy & Heart Disease, Get SMART - Goal Setting: - Group verbal and written instruction through game format to discuss heart disease and the return to sexual intimacy. Provides group verbal and written material to discuss and apply goal setting through the application of the S.M.A.R.T. Method.   Other Matters of the Heart: - Provides group verbal, written materials and models to describe Stable Angina and Peripheral Artery. Includes description of the  disease process and treatment options available to the cardiac patient.   Exercise & Equipment Safety: - Individual verbal instruction and demonstration of equipment use and safety with use of the equipment.   Cardiac Rehab  from 10/05/2017 in [facility] Cardiac and Pulmonary Rehab  Date  09/28/17  Educator  [facility]  Instruction Review Code  1- Verbalizes Understanding      Infection Prevention: - Provides verbal and written material to individual with discussion of infection control including proper hand washing and proper equipment cleaning during exercise session.   Cardiac Rehab from 10/05/2017 in [facility] Cardiac and Pulmonary Rehab  Date  09/28/17  Educator  [facility]  Instruction Review Code  1- Verbalizes Understanding      Falls Prevention: - Provides verbal and written material to individual with discussion of falls prevention and safety.   Cardiac Rehab from 10/05/2017 in [facility] Cardiac and Pulmonary Rehab  Date  09/28/17  Educator  [facility]  Instruction Review Code  1- Verbalizes Understanding      Diabetes: - Individual verbal and written instruction to review signs/symptoms of diabetes, desired ranges of glucose level fasting, after meals and with exercise. Acknowledge that pre and post exercise glucose checks will be done for 3 sessions at entry of program.   Know Your Numbers and Risk Factors: -Group verbal and written instruction about important numbers in your health.  Discussion of what are risk factors and how they play a role in the disease process.  Review of Cholesterol, Blood Pressure, Diabetes, and BMI and the role they play in your overall health.   Sleep Hygiene: -Provides group verbal and written instruction about how sleep can affect your health.  Define sleep hygiene, discuss sleep cycles and impact of sleep habits. Review good sleep hygiene tips.    Cardiac Rehab from 10/05/2017 in [facility] Cardiac and Pulmonary Rehab  Date  09/30/17  Educator  [facility]  Instruction Review Code  1- Verbalizes Understanding      Other: -Provides group and verbal instruction on various topics (see comments)   Knowledge Questionnaire Score: Knowledge Questionnaire Score - 09/28/17 1046      Knowledge Questionnaire Score     Pre Score  24/26 Reviewed results with pt today.  Education focuses: Nutrition and Exercise       Core Components/Risk Factors/Patient Goals at Admission: Personal Goals and Risk Factors at Admission - 09/28/17 1204      Core Components/Risk Factors/Patient Goals on Admission    Weight Management  Yes;Weight Loss    Intervention  Weight Management: Develop a combined nutrition and exercise program designed to reach desired caloric intake, while maintaining appropriate intake of nutrient and fiber, sodium and fats, and appropriate energy expenditure required for the weight goal.;Weight Management: Provide education and appropriate resources to help participant work on and attain dietary goals.;Weight Management/Obesity: Establish reasonable short term and long term weight goals.    Admit Weight  175 lb (79.4 kg)    Goal Weight: Short Term  170 lb (77.1 kg)    Goal Weight: Long Term  170 lb (77.1 kg)    Expected Outcomes  Short Term: Continue to assess and modify interventions until short term weight is achieved;Long Term: Adherence to nutrition and physical activity/exercise program aimed toward attainment of established weight goal;Weight Loss: Understanding of general recommendations for a balanced deficit meal plan, which promotes 1-2 lb weight loss per week and includes a negative energy balance of [phone] kcal/d;Understanding recommendations for meals to include 15-35% energy as protein, 25-35%  energy from fat, 35-60% energy from carbohydrates, less than '200mg'$  of dietary cholesterol, 20-35 gm of total fiber daily;Understanding of distribution of calorie intake throughout the day with the consumption of 4-5 meals/snacks    Tobacco Cessation  Yes Quit [place] information given    Number of packs per day  3/4 a pack    Intervention  Assist the participant in steps to quit. Provide individualized education and counseling about committing to Tobacco Cessation, relapse prevention, and pharmacological  support that can be provided by physician.;Advice worker, assist with locating and accessing local/national Quit Smoking programs, and support quit date choice.    Expected Outcomes  Short Term: Will demonstrate readiness to quit, by selecting a quit date.;Long Term: Complete abstinence from all tobacco products for at least 12 months from quit date.;Short Term: Will quit all tobacco product use, adhering to prevention of relapse plan.    Hypertension  Yes    Intervention  Provide education on lifestyle modifcations including regular physical activity/exercise, weight management, moderate sodium restriction and increased consumption of fresh fruit, vegetables, and low fat dairy, alcohol moderation, and smoking cessation.;Monitor prescription use compliance.    Expected Outcomes  Short Term: Continued assessment and intervention until BP is < 140/5m HG in hypertensive participants. < 130/832mHG in hypertensive participants with diabetes, heart failure or chronic kidney disease.;Long Term: Maintenance of blood pressure at goal levels.    Lipids  Yes    Intervention  Provide education and support for participant on nutrition & aerobic/resistive exercise along with prescribed medications to achieve LDL '70mg'$ , HDL >'40mg'$ .    Expected Outcomes  Short Term: Participant states understanding of desired cholesterol values and is compliant with medications prescribed. Participant is following exercise prescription and nutrition guidelines.;Long Term: Cholesterol controlled with medications as prescribed, with individualized exercise RX and with personalized nutrition plan. Value goals: LDL < '70mg'$ , HDL > 40 mg.       Core Components/Risk Factors/Patient Goals Review:  Goals and Risk Factor Review    Row Name 10/05/17 [id number]           Core Components/Risk Factors/Patient Goals Review   Personal Goals Review  Tobacco Cessation       Review  KeLanny Hursts interested in quitting smoking for good.  He is down to 5 cigarettes in last 24 hours.  We talked some about it today and he is ready to quit.  Gave him quit smoking packet and fake cigarette to use/hold.  Also talked about combination therapy with gum that he is already using and patches. He would like to avoid using Chantix if possible.  We will talk about setting a quit date later this week as he tries the fake cigarette and look into using patches with gum.        Expected Outcomes  Short: Set quit date.  Long: Continue to remain tobacco free          Core Components/Risk Factors/Patient Goals at Discharge (Final Review):  Goals and Risk Factor Review - 10/05/17 0852      Core Components/Risk Factors/Patient Goals Review   Personal Goals Review  Tobacco Cessation    Review  KeLanny Hursts interested in quitting smoking for good. He is down to 5 cigarettes in last 24 hours.  We talked some about it today and he is ready to quit.  Gave him quit smoking packet and fake cigarette to use/hold.  Also talked about combination therapy with gum that he is already using and  patches. He would like to avoid using Chantix if possible.  We will talk about setting a quit date later this week as he tries the fake cigarette and look into using patches with gum.     Expected Outcomes  Short: Set quit date.  Long: Continue to remain tobacco free       ITP Comments: ITP Comments    Row Name 09/28/17 1202 10/07/17 0534         ITP Comments  Med Review completed. Initial ITP created. Diagnosis can be found in [facility] 08/19/17  30 day review. Continue with ITP unless directed changes per Medical Director New to program         Comments:

## 2017-10-08 ENCOUNTER — Other Ambulatory Visit: Payer: 59

## 2017-10-14 DIAGNOSIS — I214 Non-ST elevation (NSTEMI) myocardial infarction: Secondary | ICD-10-CM | POA: Diagnosis not present

## 2017-10-14 NOTE — Progress Notes (Signed)
Daily Session Note  Patient Details  Name: Nathan Gray MRN: [id number] Date of Birth: April 11, 1971 Referring Provider:     Cardiac Rehab from 09/28/2017 in [facility] Cardiac and Pulmonary Rehab  Referring Provider  Gray, Nathan Gave MD      Encounter Date: 10/14/2017  Check In: Session Check In - 10/14/17 0742      Check-In   Location  [facility]-Cardiac & Pulmonary Rehab    Staff Present  Heath Lark, RN, BSN, CCRP;Jessica Luan Pulling, MA, RCEP, CCRP, Exercise Physiologist;Ibraham Levi Flavia Shipper    Supervising physician immediately available to respond to emergencies  See telemetry face sheet for immediately available ER MD    Medication changes reported      No    Fall or balance concerns reported     No    Tobacco Cessation  Use Increase 6 CIGS    Warm-up and Cool-down  Performed on first and last piece of equipment    Resistance Training Performed  Yes    VAD Patient?  No      Pain Assessment   Currently in Pain?  No/denies          Social History   Tobacco Use  Smoking Status Current Every Day Smoker  . Packs/day: 0.25  . Years: 20.00  . Pack years: 5.00  . Types: Cigarettes  Smokeless Tobacco Former User  Tobacco Comment   09/30/17 down to cigarettes in last 24 hrs    Goals Met:  Independence with exercise equipment Exercise tolerated well No report of cardiac concerns or symptoms Strength training completed today  Goals Unmet:  Not Applicable  Comments: Pt able to follow exercise prescription today without complaint.  Will continue to monitor for progression.   Dr. Emily Gray is Medical Director for [place] and [facility].

## 2017-10-16 DIAGNOSIS — Z955 Presence of coronary angioplasty implant and graft: Secondary | ICD-10-CM

## 2017-10-16 DIAGNOSIS — I214 Non-ST elevation (NSTEMI) myocardial infarction: Secondary | ICD-10-CM | POA: Diagnosis not present

## 2017-10-16 NOTE — Progress Notes (Signed)
Daily Session Note  Patient Details  Name: Nathan Gray MRN: [id number] Date of Birth: Mar 02, 1971 Referring Provider:     Cardiac Rehab from 09/28/2017 in [facility] Cardiac and Pulmonary Rehab  Referring Provider  End, [name] MD      Encounter Date: 10/16/2017  Check In: Session Check In - 10/16/17 0909      Check-In   Location  [facility]-Cardiac & Pulmonary Rehab    Staff Present  Renita Papa, RN BSN;Jessica Luan Pulling, MA, RCEP, CCRP, Exercise Physiologist;Drayke Grabel Oletta Darter, [place], ACSM CEP, Exercise Physiologist    Supervising physician immediately available to respond to emergencies  See telemetry face sheet for immediately available ER MD    Medication changes reported      No    Fall or balance concerns reported     No    Warm-up and Cool-down  Performed on first and last piece of equipment    Resistance Training Performed  Yes    VAD Patient?  No      Pain Assessment   Currently in Pain?  No/denies    Multiple Pain Sites  No          Social History   Tobacco Use  Smoking Status Current Every Day Smoker  . Packs/day: 0.25  . Years: 20.00  . Pack years: 5.00  . Types: Cigarettes  Smokeless Tobacco Former User  Tobacco Comment   09/30/17 down to cigarettes in last 24 hrs    Goals Met:  Independence with exercise equipment Exercise tolerated well No report of cardiac concerns or symptoms Strength training completed today  Goals Unmet: Not appliacable   Comments: Pt able to follow exercise prescription today without complaint.  Will continue to monitor for progression.    Dr. Emily Filbert is Medical Director for [place] and [facility].

## 2017-10-19 ENCOUNTER — Encounter: Payer: 59 | Attending: Internal Medicine

## 2017-10-19 DIAGNOSIS — Z79899 Other long term (current) drug therapy: Secondary | ICD-10-CM | POA: Insufficient documentation

## 2017-10-19 DIAGNOSIS — F17210 Nicotine dependence, cigarettes, uncomplicated: Secondary | ICD-10-CM | POA: Insufficient documentation

## 2017-10-19 DIAGNOSIS — I10 Essential (primary) hypertension: Secondary | ICD-10-CM | POA: Insufficient documentation

## 2017-10-19 DIAGNOSIS — Z7982 Long term (current) use of aspirin: Secondary | ICD-10-CM | POA: Insufficient documentation

## 2017-10-19 DIAGNOSIS — I2510 Atherosclerotic heart disease of native coronary artery without angina pectoris: Secondary | ICD-10-CM | POA: Insufficient documentation

## 2017-10-19 DIAGNOSIS — E785 Hyperlipidemia, unspecified: Secondary | ICD-10-CM | POA: Insufficient documentation

## 2017-10-19 DIAGNOSIS — Z955 Presence of coronary angioplasty implant and graft: Secondary | ICD-10-CM | POA: Insufficient documentation

## 2017-10-19 DIAGNOSIS — I214 Non-ST elevation (NSTEMI) myocardial infarction: Secondary | ICD-10-CM | POA: Insufficient documentation

## 2017-10-29 DIAGNOSIS — I214 Non-ST elevation (NSTEMI) myocardial infarction: Secondary | ICD-10-CM

## 2017-10-29 DIAGNOSIS — I2510 Atherosclerotic heart disease of native coronary artery without angina pectoris: Secondary | ICD-10-CM | POA: Diagnosis not present

## 2017-10-29 DIAGNOSIS — E785 Hyperlipidemia, unspecified: Secondary | ICD-10-CM | POA: Diagnosis not present

## 2017-10-29 DIAGNOSIS — I10 Essential (primary) hypertension: Secondary | ICD-10-CM | POA: Diagnosis not present

## 2017-10-29 DIAGNOSIS — Z79899 Other long term (current) drug therapy: Secondary | ICD-10-CM | POA: Diagnosis not present

## 2017-10-29 DIAGNOSIS — F17210 Nicotine dependence, cigarettes, uncomplicated: Secondary | ICD-10-CM | POA: Diagnosis not present

## 2017-10-29 DIAGNOSIS — Z7982 Long term (current) use of aspirin: Secondary | ICD-10-CM | POA: Diagnosis not present

## 2017-10-29 DIAGNOSIS — Z955 Presence of coronary angioplasty implant and graft: Secondary | ICD-10-CM | POA: Diagnosis not present

## 2017-10-29 NOTE — Progress Notes (Signed)
Daily Session Note  Patient Details  Name: Nathan Gray MRN: [id number] Date of Birth: 1971-03-05 Referring Provider:     Cardiac Rehab from 09/28/2017 in [facility] Cardiac and Pulmonary Rehab  Referring Provider  End, [name] MD      Encounter Date: 10/29/2017  Check In: Session Check In - 10/29/17 1618      Check-In   Location  [facility]-Cardiac & Pulmonary Rehab    Staff Present  Justin Mend Jaci Carrel, BS, ACSM CEP, Exercise Physiologist;Meredith Sherryll Burger, RN BSN    Supervising physician immediately available to respond to emergencies  See telemetry face sheet for immediately available ER MD    Medication changes reported      No    Fall or balance concerns reported     No    Tobacco Cessation  Use Decreased 5 cigs    Warm-up and Cool-down  Performed on first and last piece of equipment    Resistance Training Performed  Yes    VAD Patient?  No      Pain Assessment   Currently in Pain?  No/denies          Social History   Tobacco Use  Smoking Status Current Every Day Smoker  . Packs/day: 0.25  . Years: 20.00  . Pack years: 5.00  . Types: Cigarettes  Smokeless Tobacco Former User  Tobacco Comment   09/30/17 down to cigarettes in last 24 hrs    Goals Met:  Independence with exercise equipment Exercise tolerated well No report of cardiac concerns or symptoms Strength training completed today  Goals Unmet:  Not Applicable  Comments: Pt able to follow exercise prescription today without complaint.  Will continue to monitor for progression.   Dr. Emily Filbert is Medical Director for [place] and [facility].

## 2017-11-02 ENCOUNTER — Encounter: Payer: 59 | Admitting: *Deleted

## 2017-11-02 DIAGNOSIS — Z955 Presence of coronary angioplasty implant and graft: Secondary | ICD-10-CM

## 2017-11-02 DIAGNOSIS — I214 Non-ST elevation (NSTEMI) myocardial infarction: Secondary | ICD-10-CM | POA: Diagnosis not present

## 2017-11-02 NOTE — Progress Notes (Signed)
Daily Session Note  Patient Details  Name: Nathan Gray MRN: [id number] Date of Birth: May 06, 1971 Referring Provider:     Cardiac Rehab from 09/28/2017 in [facility] Cardiac and Pulmonary Rehab  Referring Provider  End, [name] MD      Encounter Date: 11/02/2017  Check In: Session Check In - 11/02/17 1708      Check-In   Location  [facility]-Cardiac & Pulmonary Rehab    Staff Present  Gerlene Burdock, RN, Moises Blood, BS, ACSM CEP, Exercise Physiologist;Amanda Oletta Darter, [place], ACSM CEP, Exercise Physiologist    Supervising physician immediately available to respond to emergencies  See telemetry face sheet for immediately available ER MD    Medication changes reported      No    Fall or balance concerns reported     No    Tobacco Cessation  No Change    Warm-up and Cool-down  Performed on first and last piece of equipment    Resistance Training Performed  Yes    VAD Patient?  No      Pain Assessment   Currently in Pain?  No/denies    Multiple Pain Sites  No          Social History   Tobacco Use  Smoking Status Current Every Day Smoker  . Packs/day: 0.25  . Years: 20.00  . Pack years: 5.00  . Types: Cigarettes  Smokeless Tobacco Former User  Tobacco Comment   09/30/17 down to cigarettes in last 24 hrs    Goals Met:  Independence with exercise equipment Exercise tolerated well No report of cardiac concerns or symptoms Strength training completed today  Goals Unmet:  Not Applicable  Comments: Pt able to follow exercise prescription today without complaint.  Will continue to monitor for progression.    Dr. Emily Filbert is Medical Director for [place] and [facility].

## 2017-11-03 NOTE — Progress Notes (Signed)
Follow-up Outpatient Visit Date: 11/04/2017  Primary Care Provider: Reubin Gray, Nathan H, MD [address] [place] [id number]  Chief Complaint: Follow-up coronary artery disease  HPI:  Nathan Gray is a 47 y.o. year-old male with history of CAD with NSTEMI status post PCI to RCA (4/19), hypertension, tobacco use, and anxiety, who presents for follow-up of CAD.  He was last seen in our office in April shortly after his preceding NSTEMI.  He was doing well at that time and has enrolled in cardiac rehab.  He was also in the process of cutting down on his cigarette consumption and was planning to quit.  Today, Nathan Gray reports feeling quite well.  He denies chest pain, shortness of breath, palpitations, orthopnea, PND, and edema.  He notes a single episode of lightheadedness that occurred while doing a downward Pilates position.  He did not pass out and has not had any further recurrence.  He is tolerating his current medications well, including dual antiplatelet therapy with aspirin and ticagrelor.  He notes that he bruises more easily but has not had any significant bleeding.  He continues to participate in cardiac rehab as well as go to the gym on his own.  Unfortunately, Nathan Gray is been unable to stop smoking altogether.  He still smokes about 4 cigarettes a day.  He inquires about pharmacotherapy to help him quit.  He has tried using nicotine gum.  --------------------------------------------------------------------------------------------------  Past Medical History:  Diagnosis Date  . Anxiety   . CAD (coronary artery disease)    a. 08/2017 NSTEMI/PCI: LM nl, LAD 30ost, 2338m, D1 50ost, RI nl, LCX small/nl, RCA 70p/3745m (3.0x28 [place] DES), EF 65%.  . History of echocardiogram    a. 08/2017 Echo: EF 65-70%, no rwma, mildly dil LA.  Marland Kitchen. Hyperlipidemia   . Hypertension   . Tobacco abuse    Past Surgical History:  Procedure Laterality Date  . APPENDECTOMY    . CORONARY  STENT INTERVENTION N/A 08/19/2017   Procedure: CORONARY STENT INTERVENTION;  Surgeon: Nathan Gray, Nathan Zahner, MD;  Location: [facility] INVASIVE CV LAB;  Service: Cardiovascular;  Laterality: N/A;  . LEFT HEART CATH AND CORONARY ANGIOGRAPHY N/A 08/19/2017   Procedure: LEFT HEART CATH AND CORONARY ANGIOGRAPHY;  Surgeon: Nathan Gray, Nathan Sciandra, MD;  Location: [facility] INVASIVE CV LAB;  Service: Cardiovascular;  Laterality: N/A;   Current Meds  Medication Sig  . aspirin 81 MG chewable tablet Chew 1 tablet (81 mg total) by mouth daily.  Marland Kitchen. atorvastatin (LIPITOR) 80 MG tablet Take 1 tablet (80 mg total) by mouth daily at 6 PM.  . carvedilol (COREG) 6.25 MG tablet Take 1 tablet (6.25 mg total) by mouth 2 (two) times daily with a meal.  . citalopram (CELEXA) 20 MG tablet TAKE 1 TABLET (20 MG TOTAL) BY MOUTH DAILY.  Marland Kitchen. losartan (COZAAR) 50 MG tablet Take 1 tablet (50 mg total) by mouth daily.  . nitroGLYCERIN (NITROSTAT) 0.4 MG SL tablet Place 1 tablet (0.4 mg total) under the tongue every 5 (five) minutes as needed for chest pain.  . ticagrelor (BRILINTA) 90 MG TABS tablet Take 1 tablet (90 mg total) by mouth 2 (two) times daily.    Allergies: Amoxicillin and Penicillins  Social History   Tobacco Use  . Smoking status: Current Every Day Smoker    Packs/day: 0.25    Years: 20.00    Pack years: 5.00    Types: Cigarettes  . Smokeless tobacco: Former NeurosurgeonUser  . Tobacco comment: 09/30/17 down to cigarettes  in last 24 hrs  Substance Use Topics  . Alcohol use: Yes    Comment: few drinks/week  . Drug use: Yes    Types: Marijuana    Comment: smoked mj for first time in 20 yrs about 1 wk ago.    Family History  Problem Relation Age of Onset  . Hypertension Mother   . Diabetes Father   . Heart attack Father        first MI in mid-50's  . CAD Father        s/p CABG  . Diabetes Brother   . Cancer Paternal Grandfather     Review of Systems: A 12-system review of systems was performed and was negative except as noted in  the HPI.  --------------------------------------------------------------------------------------------------  Physical Exam: BP 120/78 (BP Location: Left Arm, Patient Position: Sitting, Cuff Size: Normal)   Pulse 67   Ht 5\' 11"  (1.803 m)   Wt 172 lb 8 oz (78.2 kg)   BMI 24.06 kg/m   General: NAD. HEENT: No conjunctival pallor or scleral icterus. Moist mucous membranes.  OP clear. Neck: Supple without lymphadenopathy, thyromegaly, JVD, or HJR. Lungs: Normal work of breathing. Clear to auscultation bilaterally without wheezes or crackles. Heart: Regular rate and rhythm without murmurs, rubs, or gallops. Non-displaced PMI. Abd: Bowel sounds present. Soft, NT/ND without hepatosplenomegaly Ext: No lower extremity edema. Radial, PT, and DP pulses are 2+ bilaterally. Skin: Warm and dry without rash.  Lab Results  Component Value Date   WBC 7.9 08/20/2017   HGB 14.0 08/20/2017   HCT 40.9 08/20/2017   MCV 94.4 08/20/2017   PLT 208 08/20/2017    Lab Results  Component Value Date   NA 139 08/21/2017   K 3.8 08/21/2017   CL 105 08/21/2017   CO2 28 08/21/2017   BUN 24 (H) 08/21/2017   CREATININE 0.79 08/21/2017   GLUCOSE 102 (H) 08/21/2017   ALT 33 08/21/2017    Lab Results  Component Value Date   CHOL 192 08/21/2017   HDL 47 08/21/2017   LDLCALC 94 08/21/2017   TRIG 254 (H) 08/21/2017   CHOLHDL 4.1 08/21/2017    --------------------------------------------------------------------------------------------------  ASSESSMENT AND PLAN: Coronary artery disease without angina Nathan Gray has recovered well from his NSTEMI in early April with DES placement to the mid RCA.  He has not had any further chest pain or shortness of breath.  He is exercising regularly and has made excellent lifestyle changes.  We will continue at least 12 months of DAPT with aspirin and ticagrelor.  We will also continue carvedilol 6.25 mg twice daily.  Hypertension Blood pressure well controlled  today.  Continue carvedilol and losartan.  Hyperlipidemia LDL lightly above goal at the time of MI.  Mr. Plack is tolerating high intensity statin therapy well.  He should continue atorvastatin 80 mg daily with repeat lipid panel and ALT already ordered for late June/early July.  Tobacco abuse Unfortunately, Mr. Placencia continues to smoke.  He is very interested in quitting.  We have discussed options to help him stop and have agreed to start Chantix.  Potential side effects were discussed with Mr. Robison.  Follow-up: Return to clinic in 3 months.  Nathan Kendall, MD 11/04/2017 1:42 PM

## 2017-11-04 ENCOUNTER — Encounter: Payer: Self-pay | Admitting: Internal Medicine

## 2017-11-04 ENCOUNTER — Encounter: Payer: Self-pay | Admitting: *Deleted

## 2017-11-04 ENCOUNTER — Ambulatory Visit: Payer: 59 | Admitting: Internal Medicine

## 2017-11-04 VITALS — BP 120/78 | HR 67 | Ht 71.0 in | Wt 172.5 lb

## 2017-11-04 DIAGNOSIS — Z720 Tobacco use: Secondary | ICD-10-CM

## 2017-11-04 DIAGNOSIS — I2510 Atherosclerotic heart disease of native coronary artery without angina pectoris: Secondary | ICD-10-CM | POA: Insufficient documentation

## 2017-11-04 DIAGNOSIS — I25118 Atherosclerotic heart disease of native coronary artery with other forms of angina pectoris: Secondary | ICD-10-CM | POA: Insufficient documentation

## 2017-11-04 DIAGNOSIS — I10 Essential (primary) hypertension: Secondary | ICD-10-CM | POA: Diagnosis not present

## 2017-11-04 DIAGNOSIS — Z955 Presence of coronary angioplasty implant and graft: Secondary | ICD-10-CM

## 2017-11-04 DIAGNOSIS — I214 Non-ST elevation (NSTEMI) myocardial infarction: Secondary | ICD-10-CM

## 2017-11-04 DIAGNOSIS — E785 Hyperlipidemia, unspecified: Secondary | ICD-10-CM

## 2017-11-04 DIAGNOSIS — I25119 Atherosclerotic heart disease of native coronary artery with unspecified angina pectoris: Secondary | ICD-10-CM | POA: Insufficient documentation

## 2017-11-04 MED ORDER — VARENICLINE TARTRATE 1 MG PO TABS: 1.0000 mg | ORAL_TABLET | Freq: Two times a day (BID) | ORAL | 0 refills | Status: DC

## 2017-11-04 MED ORDER — VARENICLINE TARTRATE 0.5 MG X 11 & 1 MG X 42 PO MISC: ORAL | 0 refills | Status: DC

## 2017-11-04 NOTE — Patient Instructions (Addendum)
Medication Instructions:  Your physician recommends that you continue on your current medications as directed. Please refer to the Current Medication list given to you today. CHANTIX - Take 1 tablet (0.5 mg) by mouth once a day for 3 days, then increase to 1 tablet (0.5 mg) by mouth once a day for 4 days, then increase to 1 mg tablet by mouth two times a day. (You have been given a paper prescription.) Usually it is a 12 week process.   Labwork: Your physician recommends that you return for lab work in: End of June for LIPID/LIVER. You will need to be fasting.    Testing/Procedures: none  Follow-Up: Your physician recommends that you schedule a follow-up appointment in: 3 MONTHS WITH DR END OR APP.   If you need a refill on your cardiac medications before your next appointment, please call your pharmacy.    Steps to Quit Smoking Smoking tobacco can be bad for your health. It can also affect almost every organ in your body. Smoking puts you and people around you at risk for many serious long-lasting (chronic) diseases. Quitting smoking is hard, but it is one of the best things that you can do for your health. It is never too late to quit. What are the benefits of quitting smoking? When you quit smoking, you lower your risk for getting serious diseases and conditions. They can include:  Lung cancer or lung disease.  Heart disease.  Stroke.  Heart attack.  Not being able to have children (infertility).  Weak bones (osteoporosis) and broken bones (fractures).  If you have coughing, wheezing, and shortness of breath, those symptoms may get better when you quit. You may also get sick less often. If you are pregnant, quitting smoking can help to lower your chances of having a baby of low birth weight. What can I do to help me quit smoking? Talk with your doctor about what can help you quit smoking. Some things you can do (strategies) include:  Quitting smoking totally, instead  of slowly cutting back how much you smoke over a period of time.  Going to in-person counseling. You are more likely to quit if you go to many counseling sessions.  Using resources and support systems, such as: ? Agricultural engineer with a Veterinary surgeon. ? Phone quitlines. ? Automotive engineer. ? Support groups or group counseling. ? Text messaging programs. ? Mobile phone apps or applications.  Taking medicines. Some of these medicines may have nicotine in them. If you are pregnant or breastfeeding, do not take any medicines to quit smoking unless your doctor says it is okay. Talk with your doctor about counseling or other things that can help you.  Talk with your doctor about using more than one strategy at the same time, such as taking medicines while you are also going to in-person counseling. This can help make quitting easier. What things can I do to make it easier to quit? Quitting smoking might feel very hard at first, but there is a lot that you can do to make it easier. Take these steps:  Talk to your family and friends. Ask them to support and encourage you.  Call phone quitlines, reach out to support groups, or work with a Veterinary surgeon.  Ask people who smoke to not smoke around you.  Avoid places that make you want (trigger) to smoke, such as: ? Bars. ? Parties. ? Smoke-break areas at work.  Spend time with people who do not smoke.  Lower the  stress in your life. Stress can make you want to smoke. Try these things to help your stress: ? Getting regular exercise. ? Deep-breathing exercises. ? Yoga. ? Meditating. ? Doing a body scan. To do this, close your eyes, focus on one area of your body at a time from head to toe, and notice which parts of your body are tense. Try to relax the muscles in those areas.  Download or buy apps on your mobile phone or tablet that can help you stick to your quit plan. There are many free apps, such as QuitGuide from the [employer] developer(Centers for  Disease Control and Prevention). You can find more support from smokefree.gov and other websites.  This information is not intended to replace advice given to you by your health care provider. Make sure you discuss any questions you have with your health care provider. Document Released: 03/01/2009 Document Revised: 01/01/2016 Document Reviewed: 09/19/2014 Elsevier Interactive Patient Education  2018 [employer]

## 2017-11-04 NOTE — Progress Notes (Signed)
Cardiac Individual Treatment Plan  Patient Details  Name: Nathan Gray MRN: [id number] Date of Birth: 23-Jul-1970 Referring Provider:     Cardiac Rehab from 09/28/2017 in [facility] Cardiac and Pulmonary Rehab  Referring Provider  End, [name] MD      Initial Encounter Date:    Cardiac Rehab from 09/28/2017 in [facility] Cardiac and Pulmonary Rehab  Date  09/28/17  Referring Provider  End, [name] MD      Visit Diagnosis: NSTEMI (non-ST elevated myocardial infarction) [facility])  Status post coronary artery stent placement  Patient's Home Medications on Admission:  Current Outpatient Medications:  .  aspirin 81 MG chewable tablet, Chew 1 tablet (81 mg total) by mouth daily., Disp: 30 tablet, Rfl: 2 .  atorvastatin (LIPITOR) 80 MG tablet, Take 1 tablet (80 mg total) by mouth daily at 6 PM., Disp: 90 tablet, Rfl: 3 .  carvedilol (COREG) 6.25 MG tablet, Take 1 tablet (6.25 mg total) by mouth 2 (two) times daily with a meal., Disp: 180 tablet, Rfl: 3 .  citalopram (CELEXA) 20 MG tablet, TAKE 1 TABLET (20 MG TOTAL) BY MOUTH DAILY., Disp: 90 tablet, Rfl: 3 .  losartan (COZAAR) 50 MG tablet, Take 1 tablet (50 mg total) by mouth daily., Disp: 90 tablet, Rfl: 3 .  nitroGLYCERIN (NITROSTAT) 0.4 MG SL tablet, Place 1 tablet (0.4 mg total) under the tongue every 5 (five) minutes as needed for chest pain., Disp: 20 tablet, Rfl: 2 .  ticagrelor (BRILINTA) 90 MG TABS tablet, Take 1 tablet (90 mg total) by mouth 2 (two) times daily., Disp: 180 tablet, Rfl: 3  Past Medical History: Past Medical History:  Diagnosis Date  . Anxiety   . CAD (coronary artery disease)    a. 08/2017 NSTEMI/PCI: LM nl, LAD 30ost, 28m D1 50ost, RI nl, LCX small/nl, RCA 70p/[id number] [place]), EF 65%.  . History of echocardiogram    a. 08/2017 Echo: EF 65-70%, no rwma, mildly dil LA.  . Marland Kitchenyperlipidemia   . Hypertension   . Tobacco abuse     Tobacco Use: Social History   Tobacco Use  Smoking Status Current Every  Day Smoker  . Packs/day: 0.25  . Years: 20.00  . Pack years: 5.00  . Types: Cigarettes  Smokeless Tobacco Former User  Tobacco Comment   09/30/17 down to cigarettes in last 24 hrs    Labs: Recent Review Flowsheet Data    Labs for ITP Cardiac and Pulmonary Rehab Latest Ref Rng & Units 08/21/2017   Cholestrol 0 - 200 mg/dL 192   LDLCALC 0 - 99 mg/dL 94   HDL >40 mg/dL 47   Trlycerides <150 mg/dL 254(H)       Exercise Target Goals:    Exercise Program Goal: Individual exercise prescription set using results from initial 6 min walk test and THRR while considering  patient's activity barriers and safety.   Exercise Prescription Goal: Initial exercise prescription builds to 30-45 minutes a day of aerobic activity, 2-3 days per week.  Home exercise guidelines will be given to patient during program as part of exercise prescription that the participant will acknowledge.  Activity Barriers & Risk Stratification: Activity Barriers & Cardiac Risk Stratification - 09/28/17 1210      Activity Barriers & Cardiac Risk Stratification   Activity Barriers  Deconditioning    Cardiac Risk Stratification  Moderate       6 Minute Walk: 6 Minute Walk    Row Name 09/28/17 1232  6 Minute Walk   Phase  Initial     Distance  1720 feet     Walk Time  6 minutes     # of Rest Breaks  0     MPH  3.26     METS  5.44     RPE  8     VO2 Peak  19.04     Symptoms  No     Resting HR  53 bpm     Resting BP  122/70     Resting Oxygen Saturation   97 %     Exercise Oxygen Saturation  during 6 min walk  99 %     Max Ex. HR  109 bpm     Max Ex. BP  146/64     2 Minute Post BP  124/70        Oxygen Initial Assessment:   Oxygen Re-Evaluation:   Oxygen Discharge (Final Oxygen Re-Evaluation):   Initial Exercise Prescription: Initial Exercise Prescription - 09/28/17 1200      Date of Initial Exercise RX and Referring Provider   Date  09/28/17    Referring Provider  End,  [name] MD      Treadmill   MPH  3.5    Grade  3.5    Minutes  15    METs  5.37      Elliptical   Level  2    Speed  5.8    Minutes  15      T5 Nustep   Level  5    SPM  100    Minutes  15    METs  4      Prescription Details   Frequency (times per week)  3    Duration  Progress to 45 minutes of aerobic exercise without signs/symptoms of physical distress      Intensity   THRR 40-80% of Max Heartrate  102-151    Ratings of Perceived Exertion  11-13    Perceived Dyspnea  0-4      Progression   Progression  Continue to progress workloads to maintain intensity without signs/symptoms of physical distress.      Resistance Training   Training Prescription  Yes    Weight  5 lbs    Reps  10-15       Perform Capillary Blood Glucose checks as needed.  Exercise Prescription Changes: Exercise Prescription Changes    Row Name 09/28/17 1200 10/05/17 0800 10/14/17 0800         Response to Exercise   Blood Pressure (Admit)  122/70  -  122/70     Blood Pressure (Exercise)  146/64  -  148/70     Blood Pressure (Exit)  124/70  -  122/70     Heart Rate (Admit)  53 bpm  -  82 bpm     Heart Rate (Exercise)  109 bpm  -  122 bpm     Heart Rate (Exit)  61 bpm  -  72 bpm     Oxygen Saturation (Admit)  97 %  -  -     Oxygen Saturation (Exercise)  99 %  -  -     Rating of Perceived Exertion (Exercise)  8  -  12     Symptoms  none  -  none     Comments  walk test results  -  -     Duration  -  -  Continue with 45  min of aerobic exercise without signs/symptoms of physical distress.     Intensity  -  -  THRR unchanged       Progression   Progression  -  -  Continue to progress workloads to maintain intensity without signs/symptoms of physical distress.     Average METs  -  -  5.79       Resistance Training   Training Prescription  -  -  Yes     Weight  -  -  5 lbs     Reps  -  -  10-15       Interval Training   Interval Training  -  -  No       Treadmill   MPH  -  -  4      Grade  -  -  4     Minutes  -  -  15     METs  -  -  6.27       Elliptical   Level  -  -  2     Speed  -  -  5.8     Minutes  -  -  15       T5 Nustep   Level  -  -  5     Minutes  -  -  15     METs  -  -  5.3       Home Exercise Plan   Plans to continue exercise at  -  [employer] (comment) walking and [employer] (comment) walking and [employer]     Frequency  -  Add 3 additional days to program exercise sessions.  Add 3 additional days to program exercise sessions.     Initial Home Exercises Provided  -  10/05/17  10/05/17        Exercise Comments: Exercise Comments    Row Name 09/30/17 0845           Exercise Comments  First full day of exercise!  Patient was oriented to gym and equipment including functions, settings, policies, and procedures.  Patient's individual exercise prescription and treatment plan were reviewed.  All starting workloads were established based on the results of the 6 minute walk test done at initial orientation visit.  The plan for exercise progression was also introduced and progression will be customized based on patient's performance and goals.          Exercise Goals and Review: Exercise Goals    Row Name 09/28/17 1235             Exercise Goals   Increase Physical Activity  Yes       Intervention  Provide advice, education, support and counseling about physical activity/exercise needs.;Develop an individualized exercise prescription for aerobic and resistive training based on initial evaluation findings, risk stratification, comorbidities and participant's personal goals.       Expected Outcomes  Short Term: Attend rehab on a regular basis to increase amount of physical activity.;Long Term: Add in home exercise to make exercise part of routine and to increase amount of physical activity.;Long Term: Exercising regularly at least 3-5 days a week.       Increase Strength and Stamina  Yes        Intervention  Provide advice, education, support and counseling about physical activity/exercise needs.;Develop an individualized exercise prescription for aerobic and resistive training based on initial evaluation findings, risk stratification, comorbidities  and participant's personal goals.       Expected Outcomes  Short Term: Increase workloads from initial exercise prescription for resistance, speed, and METs.;Short Term: Perform resistance training exercises routinely during rehab and add in resistance training at home;Long Term: Improve cardiorespiratory fitness, muscular endurance and strength as measured by increased METs and functional capacity (6MWT)       Able to understand and use rate of perceived exertion (RPE) scale  Yes       Intervention  Provide education and explanation on how to use RPE scale       Expected Outcomes  Short Term: Able to use RPE daily in rehab to express subjective intensity level;Long Term:  Able to use RPE to guide intensity level when exercising independently       Knowledge and understanding of Target Heart Rate Range (THRR)  Yes       Intervention  Provide education and explanation of THRR including how the numbers were predicted and where they are located for reference       Expected Outcomes  Short Term: Able to state/look up THRR;Short Term: Able to use daily as guideline for intensity in rehab;Long Term: Able to use THRR to govern intensity when exercising independently       Able to check pulse independently  Yes       Intervention  Provide education and demonstration on how to check pulse in carotid and radial arteries.;Review the importance of being able to check your own pulse for safety during independent exercise       Expected Outcomes  Short Term: Able to explain why pulse checking is important during independent exercise;Long Term: Able to check pulse independently and accurately       Understanding of Exercise Prescription  Yes       Intervention   Provide education, explanation, and written materials on patient's individual exercise prescription       Expected Outcomes  Short Term: Able to explain program exercise prescription;Long Term: Able to explain home exercise prescription to exercise independently          Exercise Goals Re-Evaluation : Exercise Goals Re-Evaluation    Row Name 09/30/17 0845 10/05/17 0853 10/14/17 0814 10/16/17 0802       Exercise Goal Re-Evaluation   Exercise Goals Review  Increase Physical Activity;Able to understand and use rate of perceived exertion (RPE) scale;Knowledge and understanding of Target Heart Rate Range (THRR);Understanding of Exercise Prescription  Increase Physical Activity;Able to understand and use rate of perceived exertion (RPE) scale;Knowledge and understanding of Target Heart Rate Range (THRR);Increase Strength and Stamina;Able to check pulse independently;Understanding of Exercise Prescription  Increase Physical Activity;Understanding of Exercise Prescription;Increase Strength and Stamina  Increase Physical Activity;Understanding of Exercise Prescription;Increase Strength and Stamina    Comments  Reviewed RPE scale, THR and program prescription with pt today.  Pt voiced understanding and was given a copy of goals to take home.   Reviewed home exercise with pt today.  Pt plans to walk at home and go to [employer] for exercise.  Reviewed THR, pulse, RPE, sign and symptoms, NTG use, and when to call 911 or MD.  Also discussed weather considerations and indoor options.  Pt voiced understanding.  Nathan Gray is off to a good start in rehab.  He is already up to 4 mph and 4% grade on the treadmill and will be starting intervals this week. We will continue to monitor his progression.   Nathan Gray is doing well in rehab.  He  is getting in two days a week at the gym doing the same thing we do in class for 59mn.  He has not gotten back to doing his weights yet.  He will try to get back to weight and back into  habit of going to gym.  He does feel like his strength and stamia are getting better. KLanny Hursthas started to add in intervals on his machines.    Expected Outcomes  Short: Use RPE daily to regulate intensity.  Long: Follow program prescription in THR.  Short: Return to gym 2-3 days a week.  Long: Continue to exercise independently.   Short: Start interval training.  Long: Continue to exercise on off days.   Short: Continue to work on intervals and add weights back in.   Long: Continue to exercise independently       Discharge Exercise Prescription (Final Exercise Prescription Changes): Exercise Prescription Changes - 10/14/17 0800      Response to Exercise   Blood Pressure (Admit)  122/70    Blood Pressure (Exercise)  148/70    Blood Pressure (Exit)  122/70    Heart Rate (Admit)  82 bpm    Heart Rate (Exercise)  122 bpm    Heart Rate (Exit)  72 bpm    Rating of Perceived Exertion (Exercise)  12    Symptoms  none    Duration  Continue with 45 min of aerobic exercise without signs/symptoms of physical distress.    Intensity  THRR unchanged      Progression   Progression  Continue to progress workloads to maintain intensity without signs/symptoms of physical distress.    Average METs  5.79      Resistance Training   Training Prescription  Yes    Weight  5 lbs    Reps  10-15      Interval Training   Interval Training  No      Treadmill   MPH  4    Grade  4    Minutes  15    METs  6.27      Elliptical   Level  2    Speed  5.8    Minutes  15      T5 Nustep   Level  5    Minutes  15    METs  5.3      Home Exercise Plan   Plans to continue exercise at  [employer](comment) walking and [employer]   Frequency  Add 3 additional days to program exercise sessions.    Initial Home Exercises Provided  10/05/17       Nutrition:  Target Goals: Understanding of nutrition guidelines, daily intake of sodium <15070m cholesterol <20052mcalories 30% from fat and 7% or  less from saturated fats, daily to have 5 or more servings of fruits and vegetables.  Biometrics: Pre Biometrics - 09/28/17 1236      Pre Biometrics   Height  5' 9.7" (1.77 m)    Weight  174 lb 12.8 oz (79.3 kg)    Waist Circumference  34 inches    Hip Circumference  36 inches    Waist to Hip Ratio  0.94 %    BMI (Calculated)  25.31    Single Leg Stand  30 seconds        Nutrition Therapy Plan and Nutrition Goals:   Nutrition Assessments: Nutrition Assessments - 09/28/17 1046      MEDFICTS Scores   Pre Score  11  Nutrition Goals Re-Evaluation: Nutrition Goals Re-Evaluation    [place] Name 10/16/17 0810             Goals   Nutrition Goal  Meet with dieitician.       Comment  He continues to struggle with eating since he does not have a kitchen.  He is getting tired of eating out and eating chicken.  We made him an appointment with nutritionist.  He is supposed to get his kitchen applianes on June 7.       Expected Outcome  Short: Meet with dietician.  Long: Find new options to eat.           Nutrition Goals Discharge (Final Nutrition Goals Re-Evaluation): Nutrition Goals Re-Evaluation - 10/16/17 0810      Goals   Nutrition Goal  Meet with dieitician.    Comment  He continues to struggle with eating since he does not have a kitchen.  He is getting tired of eating out and eating chicken.  We made him an appointment with nutritionist.  He is supposed to get his kitchen applianes on June 7.    Expected Outcome  Short: Meet with dietician.  Long: Find new options to eat.        Psychosocial: Target Goals: Acknowledge presence or absence of significant depression and/or stress, maximize coping skills, provide positive support system. Participant is able to verbalize types and ability to use techniques and skills needed for reducing stress and depression.   Initial Review & Psychosocial Screening: Initial Psych Review & Screening - 09/28/17 1207      Initial Review    Current issues with  Current Stress Concerns;Current Sleep Concerns He has a hard time getting to bed due to no set routine    Source of Stress Concerns  Occupation    Comments  Nathan Gray reports a having a stressful job, but he is "used to it" after 10 years. His boss has been very accommodating with his heart attack.       Family Dynamics   Good Support System?  Yes family      Barriers   Psychosocial barriers to participate in program  There are no identifiable barriers or psychosocial needs.;The patient should benefit from training in stress management and relaxation.      Screening Interventions   Interventions  Encouraged to exercise;Provide feedback about the scores to participant;Program counselor consult;To provide support and resources with identified psychosocial needs    Expected Outcomes  Short Term goal: Utilizing psychosocial counselor, staff and physician to assist with identification of specific Stressors or current issues interfering with healing process. Setting desired goal for each stressor or current issue identified.;Long Term Goal: Stressors or current issues are controlled or eliminated.;Short Term goal: Identification and review with participant of any Quality of Life or Depression concerns found by scoring the questionnaire.;Long Term goal: The participant improves quality of Life and PHQ9 Scores as seen by post scores and/or verbalization of changes       Quality of Life Scores:  Quality of Life - 09/28/17 1046      Quality of Life Scores   Health/Function Pre  28.9 %    Socioeconomic Pre  30 %    Psych/Spiritual Pre  30 %    Family Pre  30 %    GLOBAL Pre  29.51 %      Scores of 19 and below usually indicate a poorer quality of life in these areas.  A difference of  2-3  points is a clinically meaningful difference.  A difference of 2-3 points in the total score of the Quality of Life Index has been associated with significant improvement in overall quality of  life, self-image, physical symptoms, and general health in studies assessing change in quality of life.  PHQ-9: Recent Review Flowsheet Data    Depression screen [facility] 2/9 09/28/2017 11/11/2016 05/28/2016   Decreased Interest 0 1 0   Down, Depressed, Hopeless 0 1 0   PHQ - 2 Score 0 2 0   Altered sleeping 1 0 -   Tired, decreased energy 0 2 -   Change in appetite 1 0 -   Feeling bad or failure about yourself  0 0 -   Trouble concentrating 0 0 -   Moving slowly or fidgety/restless 0 0 -   Suicidal thoughts 0 0 -   PHQ-9 Score 2 4 -   Difficult doing work/chores Not difficult at all Somewhat difficult  -     Interpretation of Total Score  Total Score Depression Severity:  1-4 = Minimal depression, 5-9 = Mild depression, 10-14 = Moderate depression, 15-19 = Moderately severe depression, 20-27 = Severe depression   Psychosocial Evaluation and Intervention: Psychosocial Evaluation - 10/07/17 0859      Psychosocial Evaluation & Interventions   Interventions  Encouraged to exercise with the program and follow exercise prescription;Relaxation education;Stress management education    Comments  Counselor met with Mr. Kentner Nathan Gray) for initial psychosocial evaluation.  He is a 47 year old who had a heart attack and a stent inserted on 4/3.  Nathan Gray's parents live locally and they are his primary support system.  He reports being in fairly good health; sleeping better since the surgery; and his appetite is challenging wondering what he can and cannot eat now.  He has a history of anxiety and has been on medications for approximately one year - which he reports are helping.  He denies any depression and states he is typically in a positive mood now.  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  Also, his eating habits have become stressful as he is remodeling his kitchen and does not have the ability to cook healthy foods  currently.  He has goals to lose weight; tone up and eat healthier.  He has a Building services engineer that he will continue using upon completion of this program.  Staff will follow with Nathan Gray.    Expected Outcomes  Short:  Nathan Gray will meet with the dietician and attend nutrition education to meet his healthy weight/diet goal.   Long:  Nathan Gray will exercise consistently for his heart health as well as for stress management.      Continue Psychosocial Services   Follow up required by staff       Psychosocial Re-Evaluation: Psychosocial Re-Evaluation    [place] Name 10/16/17 0815             Psychosocial Re-Evaluation   Current issues with  Current Stress Concerns       Comments  He is finishing his house to get ready to sell and move closer to work in [place].  He doesn't want to drive 50 miles to work.  On Monday's he is able to work from home and go in on Wednesday and Friday.  His job allows him flexibilty.  He will be finishing his kitchen soon.   The disarray is making him  stressed.   Nathan Gray has been sleeping good about 6-8 hours each night.  Previously. he was sleeping 3-4 hours.         Expected Outcomes  Short: Continue to work on [employer].  Long: Continue to cope well and work on stress.        Interventions  Encouraged to attend Cardiac Rehabilitation for the exercise;Stress management education       Continue Psychosocial Services   Follow up required by staff          Psychosocial Discharge (Final Psychosocial Re-Evaluation): Psychosocial Re-Evaluation - 10/16/17 0815      Psychosocial Re-Evaluation   Current issues with  Current Stress Concerns    Comments  He is finishing his house to get ready to sell and move closer to work in [place].  He doesn't want to drive 50 miles to work.  On Monday's he is able to work from home and go in on Wednesday and Friday.  His job allows him flexibilty.  He will be finishing his kitchen soon.   The disarray is making him stressed.   Nathan Gray has  been sleeping good about 6-8 hours each night.  Previously. he was sleeping 3-4 hours.      Expected Outcomes  Short: Continue to work on [employer].  Long: Continue to cope well and work on stress.     Interventions  Encouraged to attend Cardiac Rehabilitation for the exercise;Stress management education    Continue Psychosocial Services   Follow up required by staff       Vocational Rehabilitation: Provide vocational rehab assistance to qualifying candidates.   Vocational Rehab Evaluation & Intervention: Vocational Rehab - 09/28/17 1209      Initial Vocational Rehab Evaluation & Intervention   Assessment shows need for Vocational Rehabilitation  No       Education: Education Goals: Education classes will be provided on a variety of topics geared toward better understanding of heart health and risk factor modification. Participant will state understanding/return demonstration of topics presented as noted by education test scores.  Learning Barriers/Preferences: Learning Barriers/Preferences - 09/28/17 1209      Learning Barriers/Preferences   Learning Barriers  None    Learning Preferences  None       Education Topics:  AED/CPR: - Group verbal and written instruction with the use of models to demonstrate the basic use of the AED with the basic ABC's of resuscitation.   Cardiac Rehab from 11/02/2017 in [facility] Cardiac and Pulmonary Rehab  Date  10/07/17  Educator  [facility]  Instruction Review Code  1- Verbalizes Understanding      General Nutrition Guidelines/Fats and Fiber: -Group instruction provided by verbal, written material, models and posters to present the general guidelines for heart healthy nutrition. Gives an explanation and review of dietary fats and fiber.   Cardiac Rehab from 11/02/2017 in [facility] Cardiac and Pulmonary Rehab  Date  10/05/17  Educator  CR  Instruction Review Code  1- Verbalizes Understanding      Controlling Sodium/Reading Food Labels: -Group  verbal and written material supporting the discussion of sodium use in heart healthy nutrition. Review and explanation with models, verbal and written materials for utilization of the food label.   Exercise Physiology & General Exercise Guidelines: - Group verbal and written instruction with models to review the exercise physiology of the cardiovascular system and associated critical values. Provides general exercise guidelines with specific guidelines to those with heart or lung disease.    Aerobic  Exercise & Resistance Training: - Gives group verbal and written instruction on the various components of exercise. Focuses on aerobic and resistive training programs and the benefits of this training and how to safely progress through these programs..   Cardiac Rehab from 11/02/2017 in [facility] Cardiac and Pulmonary Rehab  Date  11/02/17  Educator  AS  Instruction Review Code  1- Verbalizes Understanding      Flexibility, Balance, Mind/Body Relaxation: Provides group verbal/written instruction on the benefits of flexibility and balance training, including mind/body exercise modes such as yoga, pilates and tai chi.  Demonstration and skill practice provided.   Stress and Anxiety: - Provides group verbal and written instruction about the health risks of elevated stress and causes of high stress.  Discuss the correlation between heart/lung disease and anxiety and treatment options. Review healthy ways to manage with stress and anxiety.   Depression: - Provides group verbal and written instruction on the correlation between heart/lung disease and depressed mood, treatment options, and the stigmas associated with seeking treatment.   Anatomy & Physiology of the Heart: - Group verbal and written instruction and models provide basic cardiac anatomy and physiology, with the coronary electrical and arterial systems. Review of Valvular disease and Heart Failure   Cardiac Procedures: - Group verbal and  written instruction to review commonly prescribed medications for heart disease. Reviews the medication, class of the drug, and side effects. Includes the steps to properly store meds and maintain the prescription regimen. (beta blockers and nitrates)   Cardiac Medications I: - Group verbal and written instruction to review commonly prescribed medications for heart disease. Reviews the medication, class of the drug, and side effects. Includes the steps to properly store meds and maintain the prescription regimen.   Cardiac Medications II: -Group verbal and written instruction to review commonly prescribed medications for heart disease. Reviews the medication, class of the drug, and side effects. (all other drug classes)    Go Sex-Intimacy & Heart Disease, Get SMART - Goal Setting: - Group verbal and written instruction through game format to discuss heart disease and the return to sexual intimacy. Provides group verbal and written material to discuss and apply goal setting through the application of the S.M.A.R.T. Method.   Other Matters of the Heart: - Provides group verbal, written materials and models to describe Stable Angina and Peripheral Artery. Includes description of the disease process and treatment options available to the cardiac patient.   Exercise & Equipment Safety: - Individual verbal instruction and demonstration of equipment use and safety with use of the equipment.   Cardiac Rehab from 11/02/2017 in [facility] Cardiac and Pulmonary Rehab  Date  09/28/17  Educator  [facility]  Instruction Review Code  1- Verbalizes Understanding      Infection Prevention: - Provides verbal and written material to individual with discussion of infection control including proper hand washing and proper equipment cleaning during exercise session.   Cardiac Rehab from 11/02/2017 in [facility] Cardiac and Pulmonary Rehab  Date  09/28/17  Educator  [facility]  Instruction Review Code  1- Verbalizes Understanding        Falls Prevention: - Provides verbal and written material to individual with discussion of falls prevention and safety.   Cardiac Rehab from 11/02/2017 in [facility] Cardiac and Pulmonary Rehab  Date  09/28/17  Educator  [facility]  Instruction Review Code  1- Verbalizes Understanding      Diabetes: - Individual verbal and written instruction to review signs/symptoms of diabetes, desired ranges of glucose  level fasting, after meals and with exercise. Acknowledge that pre and post exercise glucose checks will be done for 3 sessions at entry of program.   Know Your Numbers and Risk Factors: -Group verbal and written instruction about important numbers in your health.  Discussion of what are risk factors and how they play a role in the disease process.  Review of Cholesterol, Blood Pressure, Diabetes, and BMI and the role they play in your overall health.   Sleep Hygiene: -Provides group verbal and written instruction about how sleep can affect your health.  Define sleep hygiene, discuss sleep cycles and impact of sleep habits. Review good sleep hygiene tips.    Cardiac Rehab from 11/02/2017 in [facility] Cardiac and Pulmonary Rehab  Date  09/30/17  Educator  [facility]  Instruction Review Code  1- Verbalizes Understanding      Other: -Provides group and verbal instruction on various topics (see comments)   Knowledge Questionnaire Score: Knowledge Questionnaire Score - 09/28/17 1046      Knowledge Questionnaire Score   Pre Score  24/26 Reviewed results with pt today.  Education focuses: Nutrition and Exercise       Core Components/Risk Factors/Patient Goals at Admission: Personal Goals and Risk Factors at Admission - 09/28/17 1204      Core Components/Risk Factors/Patient Goals on Admission    Weight Management  Yes;Weight Loss    Intervention  Weight Management: Develop a combined nutrition and exercise program designed to reach desired caloric intake, while maintaining appropriate intake of nutrient  and fiber, sodium and fats, and appropriate energy expenditure required for the weight goal.;Weight Management: Provide education and appropriate resources to help participant work on and attain dietary goals.;Weight Management/Obesity: Establish reasonable short term and long term weight goals.    Admit Weight  175 lb (79.4 kg)    Goal Weight: Short Term  170 lb (77.1 kg)    Goal Weight: Long Term  170 lb (77.1 kg)    Expected Outcomes  Short Term: Continue to assess and modify interventions until short term weight is achieved;Long Term: Adherence to nutrition and physical activity/exercise program aimed toward attainment of established weight goal;Weight Loss: Understanding of general recommendations for a balanced deficit meal plan, which promotes 1-2 lb weight loss per week and includes a negative energy balance of [id number] kcal/d;Understanding recommendations for meals to include 15-35% energy as protein, 25-35% energy from fat, 35-60% energy from carbohydrates, less than 250m of dietary cholesterol, 20-35 gm of total fiber daily;Understanding of distribution of calorie intake throughout the day with the consumption of 4-5 meals/snacks    Tobacco Cessation  Yes [place] information given    Number of packs per day  3/4 a pack    Intervention  Assist the participant in steps to quit. Provide individualized education and counseling about committing to Tobacco Cessation, relapse prevention, and pharmacological support that can be provided by physician.;OAdvice worker assist with locating and accessing local/national Quit Smoking programs, and support quit date choice.    Expected Outcomes  Short Term: Will demonstrate readiness to quit, by selecting a quit date.;Long Term: Complete abstinence from all tobacco products for at least 12 months from quit date.;Short Term: Will quit all tobacco product use, adhering to prevention of relapse plan.    Hypertension  Yes    Intervention  Provide  education on lifestyle modifcations including regular physical activity/exercise, weight management, moderate sodium restriction and increased consumption of fresh fruit, vegetables, and low fat dairy, alcohol moderation, and smoking  cessation.;Monitor prescription use compliance.    Expected Outcomes  Short Term: Continued assessment and intervention until BP is < 140/15m HG in hypertensive participants. < 130/879mHG in hypertensive participants with diabetes, heart failure or chronic kidney disease.;Long Term: Maintenance of blood pressure at goal levels.    Lipids  Yes    Intervention  Provide education and support for participant on nutrition & aerobic/resistive exercise along with prescribed medications to achieve LDL <7053mHDL >30m53m  Expected Outcomes  Short Term: Participant states understanding of desired cholesterol values and is compliant with medications prescribed. Participant is following exercise prescription and nutrition guidelines.;Long Term: Cholesterol controlled with medications as prescribed, with individualized exercise RX and with personalized nutrition plan. Value goals: LDL < 70mg59mL > 40 mg.       Core Components/Risk Factors/Patient Goals Review:  Goals and Risk Factor Review    Row Name 10/05/17 0852 [id number]           Core Components/Risk Factors/Patient Goals Review   Personal Goals Review  Tobacco Cessation  Weight Management/Obesity;Tobacco Cessation;Hypertension;Lipids      Review  KeithLanny Hurstnterested in quitting smoking for good. He is down to 5 cigarettes in last 24 hours.  We talked some about it today and he is ready to quit.  Gave him quit smoking packet and fake cigarette to use/hold.  Also talked about combination therapy with gum that he is already using and patches. He would like to avoid using Chantix if possible.  We will talk about setting a quit date later this week as he tries the fake cigarette and look into using patches with gum.    Nathan Gray's weight has been holding steady around 184.  He just wants to continue to work towards building more muscle.  He has been doing pretty well with blood pressures, today it was little higher after a cup of coffee.  KeithLanny Hurstk his blood pressures at least twice a week at home.  He is doing well on his medications.   He is still at 5-6 cigarettes a day.  He is not sure what the hang up is for him.  He is thinking about the Chantix more, but has yet to try the patches.  He has to make the decison to quit.  He can make it a day without a cigarette but eventually gets one.        Expected Outcomes  Short: Set quit date.  Long: Continue to remain tobacco free  Short: Decide about Chantix.  Long: Continue to work on risk factors.          Core Components/Risk Factors/Patient Goals at Discharge (Final Review):  Goals and Risk Factor Review - 10/16/17 0806      Core Components/Risk Factors/Patient Goals Review   Personal Goals Review  Weight Management/Obesity;Tobacco Cessation;Hypertension;Lipids    Review  Nathan Gray's weight has been holding steady around 184.  He just wants to continue to work towards building more muscle.  He has been doing pretty well with blood pressures, today it was little higher after a cup of coffee.  KeithLanny Hurstk his blood pressures at least twice a week at home.  He is doing well on his medications.   He is still at 5-6 cigarettes a day.  He is not sure what the hang up is for him.  He is thinking about the Chantix more, but has yet to try the patches.  He has to make the decison to quit.  He can make it a day without a cigarette but eventually gets one.      Expected Outcomes  Short: Decide about Chantix.  Long: Continue to work on risk factors.        ITP Comments: ITP Comments    Row Name 09/28/17 1202 10/07/17 0534 11/04/17 0619       ITP Comments  Med Review completed. Initial ITP created. Diagnosis can be found in [facility] 08/19/17  30 day review. Continue with ITP unless  directed changes per Medical Director New to program  30 day review. Continue with ITP unless directed changes per Medical Director review  2 visits in June        Comments:

## 2017-11-16 ENCOUNTER — Encounter: Payer: 59 | Attending: Internal Medicine

## 2017-11-16 DIAGNOSIS — Z955 Presence of coronary angioplasty implant and graft: Secondary | ICD-10-CM | POA: Insufficient documentation

## 2017-11-16 DIAGNOSIS — Z79899 Other long term (current) drug therapy: Secondary | ICD-10-CM | POA: Insufficient documentation

## 2017-11-16 DIAGNOSIS — E785 Hyperlipidemia, unspecified: Secondary | ICD-10-CM | POA: Insufficient documentation

## 2017-11-16 DIAGNOSIS — I2510 Atherosclerotic heart disease of native coronary artery without angina pectoris: Secondary | ICD-10-CM | POA: Insufficient documentation

## 2017-11-16 DIAGNOSIS — I10 Essential (primary) hypertension: Secondary | ICD-10-CM | POA: Insufficient documentation

## 2017-11-16 DIAGNOSIS — Z7982 Long term (current) use of aspirin: Secondary | ICD-10-CM | POA: Insufficient documentation

## 2017-11-16 DIAGNOSIS — F17210 Nicotine dependence, cigarettes, uncomplicated: Secondary | ICD-10-CM | POA: Insufficient documentation

## 2017-11-16 DIAGNOSIS — I214 Non-ST elevation (NSTEMI) myocardial infarction: Secondary | ICD-10-CM | POA: Insufficient documentation

## 2017-11-25 ENCOUNTER — Other Ambulatory Visit (INDEPENDENT_AMBULATORY_CARE_PROVIDER_SITE_OTHER): Payer: 59 | Admitting: *Deleted

## 2017-11-25 DIAGNOSIS — I2510 Atherosclerotic heart disease of native coronary artery without angina pectoris: Secondary | ICD-10-CM | POA: Diagnosis not present

## 2017-11-26 LAB — LIPID PANEL
CHOL/HDL RATIO: 2.9 ratio (ref 0.0–5.0)
Cholesterol, Total: 129 mg/dL (ref 100–199)
HDL: 44 mg/dL (ref >39)
LDL Calculated: 56 mg/dL (ref 0–99)
TRIGLYCERIDES: 143 mg/dL (ref 0–149)
VLDL Cholesterol Cal: 29 mg/dL (ref 5–40)

## 2017-11-27 ENCOUNTER — Other Ambulatory Visit: Payer: 59

## 2017-11-27 ENCOUNTER — Telehealth: Payer: Self-pay

## 2017-11-27 NOTE — Telephone Encounter (Signed)
LMOM

## 2017-12-02 ENCOUNTER — Encounter: Payer: Self-pay | Admitting: *Deleted

## 2017-12-02 DIAGNOSIS — I214 Non-ST elevation (NSTEMI) myocardial infarction: Secondary | ICD-10-CM

## 2017-12-02 DIAGNOSIS — Z955 Presence of coronary angioplasty implant and graft: Secondary | ICD-10-CM

## 2017-12-02 NOTE — Progress Notes (Signed)
Cardiac Individual Treatment Plan  Patient Details  Name: Nathan Gray MRN: [id number] Date of Birth: [birth year] Referring Provider:     Cardiac Rehab from 09/28/2017 in [facility] Cardiac and Pulmonary Rehab  Referring Provider  End, [name] MD      Initial Encounter Date:    Cardiac Rehab from 09/28/2017 in [facility] Cardiac and Pulmonary Rehab  Date  09/28/17      Visit Diagnosis: NSTEMI (non-ST elevated myocardial infarction) [facility])  Status post coronary artery stent placement  Patient's Home Medications on Admission:  Current Outpatient Medications:  .  aspirin 81 MG chewable tablet, Chew 1 tablet (81 mg total) by mouth daily., Disp: 30 tablet, Rfl: 2 .  atorvastatin (LIPITOR) 80 MG tablet, Take 1 tablet (80 mg total) by mouth daily at 6 PM., Disp: 90 tablet, Rfl: 3 .  carvedilol (COREG) 6.25 MG tablet, Take 1 tablet (6.25 mg total) by mouth 2 (two) times daily with a meal., Disp: 180 tablet, Rfl: 3 .  citalopram (CELEXA) 20 MG tablet, TAKE 1 TABLET (20 MG TOTAL) BY MOUTH DAILY., Disp: 90 tablet, Rfl: 3 .  losartan (COZAAR) 50 MG tablet, Take 1 tablet (50 mg total) by mouth daily., Disp: 90 tablet, Rfl: 3 .  nitroGLYCERIN (NITROSTAT) 0.4 MG SL tablet, Place 1 tablet (0.4 mg total) under the tongue every 5 (five) minutes as needed for chest pain., Disp: 20 tablet, Rfl: 2 .  ticagrelor (BRILINTA) 90 MG TABS tablet, Take 1 tablet (90 mg total) by mouth 2 (two) times daily., Disp: 180 tablet, Rfl: 3 .  varenicline (CHANTIX CONTINUING MONTH PAK) 1 MG tablet, Take 1 tablet (1 mg total) by mouth 2 (two) times daily., Disp: 180 tablet, Rfl: 0 .  varenicline (CHANTIX STARTING MONTH PAK) 0.5 MG X 11 & 1 MG X 42 tablet, Take one 0.5 mg tablet by mouth once daily for 3 days, then increase to one 0.5 mg tablet twice daily for 4 days, then increase to one 1 mg tablet twice daily., Disp: 53 tablet, Rfl: 0  Past Medical History: Past Medical History:  Diagnosis Date  . Anxiety   . CAD (coronary  artery disease)    a. 08/2017 NSTEMI/PCI: LM nl, LAD 30ost, 41m D1 50ost, RI nl, LCX small/nl, RCA 70p/[id number] [place]), EF 65%.  . History of echocardiogram    a. 08/2017 Echo: EF 65-70%, no rwma, mildly dil LA.  . Marland Kitchenyperlipidemia   . Hypertension   . Tobacco abuse     Tobacco Use: Social History   Tobacco Use  Smoking Status Current Every Day Smoker  . Packs/day: 0.25  . Years: 20.00  . Pack years: 5.00  . Types: Cigarettes  Smokeless Tobacco Former User  Tobacco Comment   09/30/17 down to cigarettes in last 24 hrs    Labs: Recent Review Flowsheet Data    Labs for ITP Cardiac and Pulmonary Rehab Latest Ref Rng & Units 08/21/2017 11/25/2017   Cholestrol 100 - 199 mg/dL 192 129   LDLCALC 0 - 99 mg/dL 94 56   HDL >39 mg/dL 47 44   Trlycerides 0 - 149 mg/dL 254(H) 143       Exercise Target Goals:    Exercise Program Goal: Individual exercise prescription set using results from initial 6 min walk test and THRR while considering  patient's activity barriers and safety.   Exercise Prescription Goal: Initial exercise prescription builds to 30-45 minutes a day of aerobic activity, 2-3 days per week.  Home exercise guidelines will  be given to patient during program as part of exercise prescription that the participant will acknowledge.  Activity Barriers & Risk Stratification: Activity Barriers & Cardiac Risk Stratification - 09/28/17 1210      Activity Barriers & Cardiac Risk Stratification   Activity Barriers  Deconditioning    Cardiac Risk Stratification  Moderate       6 Minute Walk: 6 Minute Walk    Row Name 09/28/17 1232         6 Minute Walk   Phase  Initial     Distance  1720 feet     Walk Time  6 minutes     # of Rest Breaks  0     MPH  3.26     METS  5.44     RPE  8     VO2 Peak  19.04     Symptoms  No     Resting HR  53 bpm     Resting BP  122/70     Resting Oxygen Saturation   97 %     Exercise Oxygen Saturation  during 6 min walk  99 %      Max Ex. HR  109 bpm     Max Ex. BP  146/64     2 Minute Post BP  124/70        Oxygen Initial Assessment:   Oxygen Re-Evaluation:   Oxygen Discharge (Final Oxygen Re-Evaluation):   Initial Exercise Prescription: Initial Exercise Prescription - 09/28/17 1200      Date of Initial Exercise RX and Referring Provider   Date  09/28/17    Referring Provider  End, [name] MD      Treadmill   MPH  3.5    Grade  3.5    Minutes  15    METs  5.37      Elliptical   Level  2    Speed  5.8    Minutes  15      T5 Nustep   Level  5    SPM  100    Minutes  15    METs  4      Prescription Details   Frequency (times per week)  3    Duration  Progress to 45 minutes of aerobic exercise without signs/symptoms of physical distress      Intensity   THRR 40-80% of Max Heartrate  102-151    Ratings of Perceived Exertion  11-13    Perceived Dyspnea  0-4      Progression   Progression  Continue to progress workloads to maintain intensity without signs/symptoms of physical distress.      Resistance Training   Training Prescription  Yes    Weight  5 lbs    Reps  10-15       Perform Capillary Blood Glucose checks as needed.  Exercise Prescription Changes: Exercise Prescription Changes    Row Name 09/28/17 1200 10/05/17 0800 10/14/17 0800 11/13/17 0900       Response to Exercise   Blood Pressure (Admit)  122/70  -  122/70  122/80    Blood Pressure (Exercise)  146/64  -  148/70  128/68    Blood Pressure (Exit)  124/70  -  122/70  104/50    Heart Rate (Admit)  53 bpm  -  82 bpm  59 bpm    Heart Rate (Exercise)  109 bpm  -  122 bpm  114 bpm    Heart  Rate (Exit)  61 bpm  -  72 bpm  58 bpm    Oxygen Saturation (Admit)  97 %  -  -  -    Oxygen Saturation (Exercise)  99 %  -  -  -    Rating of Perceived Exertion (Exercise)  8  -  12  12    Symptoms  none  -  none  none    Comments  walk test results  -  -  -    Duration  -  -  Continue with 45 min of aerobic exercise without  signs/symptoms of physical distress.  Continue with 45 min of aerobic exercise without signs/symptoms of physical distress.    Intensity  -  -  THRR unchanged  THRR unchanged      Progression   Progression  -  -  Continue to progress workloads to maintain intensity without signs/symptoms of physical distress.  Continue to progress workloads to maintain intensity without signs/symptoms of physical distress.    Average METs  -  -  5.79  6      Resistance Training   Training Prescription  -  -  Yes  Yes    Weight  -  -  5 lbs  5 lb    Reps  -  -  10-15  10-15      Interval Training   Interval Training  -  -  No  No      Treadmill   MPH  -  -  4  4    Grade  -  -  4  7    Minutes  -  -  15  15    METs  -  -  6.27  7.92      Elliptical   Level  -  -  2  -    Speed  -  -  5.8  -    Minutes  -  -  15  -      T5 Nustep   Level  -  -  5  5    Minutes  -  -  15  15    METs  -  -  5.3  4.1      Home Exercise Plan   Plans to continue exercise at  -  [employer] (comment) walking and [employer] (comment) walking and [employer] (comment) walking and [employer]    Frequency  -  Add 3 additional days to program exercise sessions.  Add 3 additional days to program exercise sessions.  Add 3 additional days to program exercise sessions.    Initial Home Exercises Provided  -  10/05/17  10/05/17  10/05/17       Exercise Comments: Exercise Comments    Row Name 09/30/17 0845           Exercise Comments  First full day of exercise!  Patient was oriented to gym and equipment including functions, settings, policies, and procedures.  Patient's individual exercise prescription and treatment plan were reviewed.  All starting workloads were established based on the results of the 6 minute walk test done at initial orientation visit.  The plan for exercise progression was also introduced and progression will be customized based on  patient's performance and goals.          Exercise Goals and Review: Exercise Goals    Row Name 09/28/17 1235  Exercise Goals   Increase Physical Activity  Yes       Intervention  Provide advice, education, support and counseling about physical activity/exercise needs.;Develop an individualized exercise prescription for aerobic and resistive training based on initial evaluation findings, risk stratification, comorbidities and participant's personal goals.       Expected Outcomes  Short Term: Attend rehab on a regular basis to increase amount of physical activity.;Long Term: Add in home exercise to make exercise part of routine and to increase amount of physical activity.;Long Term: Exercising regularly at least 3-5 days a week.       Increase Strength and Stamina  Yes       Intervention  Provide advice, education, support and counseling about physical activity/exercise needs.;Develop an individualized exercise prescription for aerobic and resistive training based on initial evaluation findings, risk stratification, comorbidities and participant's personal goals.       Expected Outcomes  Short Term: Increase workloads from initial exercise prescription for resistance, speed, and METs.;Short Term: Perform resistance training exercises routinely during rehab and add in resistance training at home;Long Term: Improve cardiorespiratory fitness, muscular endurance and strength as measured by increased METs and functional capacity (6MWT)       Able to understand and use rate of perceived exertion (RPE) scale  Yes       Intervention  Provide education and explanation on how to use RPE scale       Expected Outcomes  Short Term: Able to use RPE daily in rehab to express subjective intensity level;Long Term:  Able to use RPE to guide intensity level when exercising independently       Knowledge and understanding of Target Heart Rate Range (THRR)  Yes       Intervention  Provide education and  explanation of THRR including how the numbers were predicted and where they are located for reference       Expected Outcomes  Short Term: Able to state/look up THRR;Short Term: Able to use daily as guideline for intensity in rehab;Long Term: Able to use THRR to govern intensity when exercising independently       Able to check pulse independently  Yes       Intervention  Provide education and demonstration on how to check pulse in carotid and radial arteries.;Review the importance of being able to check your own pulse for safety during independent exercise       Expected Outcomes  Short Term: Able to explain why pulse checking is important during independent exercise;Long Term: Able to check pulse independently and accurately       Understanding of Exercise Prescription  Yes       Intervention  Provide education, explanation, and written materials on patient's individual exercise prescription       Expected Outcomes  Short Term: Able to explain program exercise prescription;Long Term: Able to explain home exercise prescription to exercise independently          Exercise Goals Re-Evaluation : Exercise Goals Re-Evaluation    Row Name 09/30/17 0845 10/05/17 0853 10/14/17 0814 10/16/17 0802 11/13/17 0939     Exercise Goal Re-Evaluation   Exercise Goals Review  Increase Physical Activity;Able to understand and use rate of perceived exertion (RPE) scale;Knowledge and understanding of Target Heart Rate Range (THRR);Understanding of Exercise Prescription  Increase Physical Activity;Able to understand and use rate of perceived exertion (RPE) scale;Knowledge and understanding of Target Heart Rate Range (THRR);Increase Strength and Stamina;Able to check pulse independently;Understanding of Exercise Prescription  Increase Physical  Activity;Understanding of Exercise Prescription;Increase Strength and Stamina  Increase Physical Activity;Understanding of Exercise Prescription;Increase Strength and Stamina   Increase Physical Activity;Able to understand and use rate of perceived exertion (RPE) scale;Knowledge and understanding of Target Heart Rate Range (THRR);Increase Strength and Stamina   Comments  Reviewed RPE scale, THR and program prescription with pt today.  Pt voiced understanding and was given a copy of goals to take home.   Reviewed home exercise with pt today.  Pt plans to walk at home and go to [employer] for exercise.  Reviewed THR, pulse, RPE, sign and symptoms, NTG use, and when to call 911 or MD.  Also discussed weather considerations and indoor options.  Pt voiced understanding.  Lanny Hurst is off to a good start in rehab.  He is already up to 4 mph and 4% grade on the treadmill and will be starting intervals this week. We will continue to monitor his progression.   Lanny Hurst is doing well in rehab.  He is getting in two days a week at the gym doing the same thing we do in class for 76mn.  He has not gotten back to doing his weights yet.  He will try to get back to weight and back into habit of going to gym.  He does feel like his strength and stamia are getting better. KLanny Hursthas started to add in intervals on his machines.  Pt is reaching HR and RPE goals. Staff will monitor progress   Expected Outcomes  Short: Use RPE daily to regulate intensity.  Long: Follow program prescription in THR.  Short: Return to gym 2-3 days a week.  Long: Continue to exercise independently.   Short: Start interval training.  Long: Continue to exercise on off days.   Short: Continue to work on intervals and add weights back in.   Long: Continue to exercise independently  Short - attend regularly Long - maintain MET increase      Discharge Exercise Prescription (Final Exercise Prescription Changes): Exercise Prescription Changes - 11/13/17 0900      Response to Exercise   Blood Pressure (Admit)  122/80    Blood Pressure (Exercise)  128/68    Blood Pressure (Exit)  104/50    Heart Rate (Admit)  59 bpm    Heart  Rate (Exercise)  114 bpm    Heart Rate (Exit)  58 bpm    Rating of Perceived Exertion (Exercise)  12    Symptoms  none    Duration  Continue with 45 min of aerobic exercise without signs/symptoms of physical distress.    Intensity  THRR unchanged      Progression   Progression  Continue to progress workloads to maintain intensity without signs/symptoms of physical distress.    Average METs  6      Resistance Training   Training Prescription  Yes    Weight  5 lb    Reps  10-15      Interval Training   Interval Training  No      Treadmill   MPH  4    Grade  7    Minutes  15    METs  7.92      T5 Nustep   Level  5    Minutes  15    METs  4.1      Home Exercise Plan   Plans to continue exercise at  [employer](comment) walking and [employer]   Frequency  Add 3 additional days  to program exercise sessions.    Initial Home Exercises Provided  10/05/17       Nutrition:  Target Goals: Understanding of nutrition guidelines, daily intake of sodium <1586m, cholesterol <2029m calories 30% from fat and 7% or less from saturated fats, daily to have 5 or more servings of fruits and vegetables.  Biometrics: Pre Biometrics - 09/28/17 1236      Pre Biometrics   Height  5' 9.7" (1.77 m)    Weight  174 lb 12.8 oz (79.3 kg)    Waist Circumference  34 inches    Hip Circumference  36 inches    Waist to Hip Ratio  0.94 %    BMI (Calculated)  25.31    Single Leg Stand  30 seconds        Nutrition Therapy Plan and Nutrition Goals: Nutrition Therapy & Goals - 11/12/17 1447      Nutrition Therapy   RD appointment deferred  -- Patient was scheduled to meet with RD 10/26/17 but did not show       Nutrition Assessments: Nutrition Assessments - 09/28/17 1046      MEDFICTS Scores   Pre Score  11       Nutrition Goals Re-Evaluation: Nutrition Goals Re-Evaluation    [place] 10/16/17 0810             Goals   Nutrition Goal  Meet with dieitician.        Comment  He continues to struggle with eating since he does not have a kitchen.  He is getting tired of eating out and eating chicken.  We made him an appointment with nutritionist.  He is supposed to get his kitchen applianes on June 7.       Expected Outcome  Short: Meet with dietician.  Long: Find new options to eat.           Nutrition Goals Discharge (Final Nutrition Goals Re-Evaluation): Nutrition Goals Re-Evaluation - 10/16/17 0810      Goals   Nutrition Goal  Meet with dieitician.    Comment  He continues to struggle with eating since he does not have a kitchen.  He is getting tired of eating out and eating chicken.  We made him an appointment with nutritionist.  He is supposed to get his kitchen applianes on June 7.    Expected Outcome  Short: Meet with dietician.  Long: Find new options to eat.        Psychosocial: Target Goals: Acknowledge presence or absence of significant depression and/or stress, maximize coping skills, provide positive support system. Participant is able to verbalize types and ability to use techniques and skills needed for reducing stress and depression.   Initial Review & Psychosocial Screening: Initial Psych Review & Screening - 09/28/17 1207      Initial Review   Current issues with  Current Stress Concerns;Current Sleep Concerns He has a hard time getting to bed due to no set routine    Source of Stress Concerns  Occupation    Comments  KeLanny Hursteports a having a stressful job, but he is "used to it" after 10 years. His boss has been very accommodating with his heart attack.       Family Dynamics   Good Support System?  Yes family      Barriers   Psychosocial barriers to participate in program  There are no identifiable barriers or psychosocial needs.;The patient should benefit from training in stress management and relaxation.  Screening Interventions   Interventions  Encouraged to exercise;Provide feedback about the scores to  participant;Program counselor consult;To provide support and resources with identified psychosocial needs    Expected Outcomes  Short Term goal: Utilizing psychosocial counselor, staff and physician to assist with identification of specific Stressors or current issues interfering with healing process. Setting desired goal for each stressor or current issue identified.;Long Term Goal: Stressors or current issues are controlled or eliminated.;Short Term goal: Identification and review with participant of any Quality of Life or Depression concerns found by scoring the questionnaire.;Long Term goal: The participant improves quality of Life and PHQ9 Scores as seen by post scores and/or verbalization of changes       Quality of Life Scores:  Quality of Life - 09/28/17 1046      Quality of Life Scores   Health/Function Pre  28.9 %    Socioeconomic Pre  30 %    Psych/Spiritual Pre  30 %    Family Pre  30 %    GLOBAL Pre  29.51 %      Scores of 19 and below usually indicate a poorer quality of life in these areas.  A difference of  2-3 points is a clinically meaningful difference.  A difference of 2-3 points in the total score of the Quality of Life Index has been associated with significant improvement in overall quality of life, self-image, physical symptoms, and general health in studies assessing change in quality of life.  PHQ-9: Recent Review Flowsheet Data    Depression screen [facility] 2/9 09/28/2017 11/11/2016 05/28/2016   Decreased Interest 0 1 0   Down, Depressed, Hopeless 0 1 0   PHQ - 2 Score 0 2 0   Altered sleeping 1 0 -   Tired, decreased energy 0 2 -   Change in appetite 1 0 -   Feeling bad or failure about yourself  0 0 -   Trouble concentrating 0 0 -   Moving slowly or fidgety/restless 0 0 -   Suicidal thoughts 0 0 -   PHQ-9 Score 2 4 -   Difficult doing work/chores Not difficult at all Somewhat difficult  -     Interpretation of Total Score  Total Score Depression Severity:    1-4 = Minimal depression, 5-9 = Mild depression, 10-14 = Moderate depression, 15-19 = Moderately severe depression, 20-27 = Severe depression   Psychosocial Evaluation and Intervention: Psychosocial Evaluation - 10/07/17 0859      Psychosocial Evaluation & Interventions   Interventions  Encouraged to exercise with the program and follow exercise prescription;Relaxation education;Stress management education    Comments  Counselor met with Mr. Voris Lanny Hurst) for initial psychosocial evaluation.  He is a 47 year old who had a heart attack and a stent inserted on 4/3.  Keith's parents live locally and they are his primary support system.  He reports being in fairly good health; sleeping better since the surgery; and his appetite is challenging wondering what he can and cannot eat now.  He has a history of anxiety and has been on medications for approximately one year - which he reports are helping.  He denies any depression and states he is typically in a positive mood now.  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  Also, his eating habits have become stressful as he is remodeling his kitchen and does not have the ability to cook healthy  foods currently.  He has goals to lose weight; tone up and eat healthier.  He has a Building services engineer that he will continue using upon completion of this program.  Staff will follow with Lanny Hurst.    Expected Outcomes  Short:  Lanny Hurst will meet with the dietician and attend nutrition education to meet his healthy weight/diet goal.   Long:  Lanny Hurst will exercise consistently for his heart health as well as for stress management.      Continue Psychosocial Services   Follow up required by staff       Psychosocial Re-Evaluation: Psychosocial Re-Evaluation    [place] Name 10/16/17 0815             Psychosocial Re-Evaluation   Current issues with  Current Stress Concerns       Comments  He is  finishing his house to get ready to sell and move closer to work in [place].  He doesn't want to drive 50 miles to work.  On Monday's he is able to work from home and go in on Wednesday and Friday.  His job allows him flexibilty.  He will be finishing his kitchen soon.   The disarray is making him stressed.   Lanny Hurst has been sleeping good about 6-8 hours each night.  Previously. he was sleeping 3-4 hours.         Expected Outcomes  Short: Continue to work on [employer].  Long: Continue to cope well and work on stress.        Interventions  Encouraged to attend Cardiac Rehabilitation for the exercise;Stress management education       Continue Psychosocial Services   Follow up required by staff          Psychosocial Discharge (Final Psychosocial Re-Evaluation): Psychosocial Re-Evaluation - 10/16/17 0815      Psychosocial Re-Evaluation   Current issues with  Current Stress Concerns    Comments  He is finishing his house to get ready to sell and move closer to work in [place].  He doesn't want to drive 50 miles to work.  On Monday's he is able to work from home and go in on Wednesday and Friday.  His job allows him flexibilty.  He will be finishing his kitchen soon.   The disarray is making him stressed.   Lanny Hurst has been sleeping good about 6-8 hours each night.  Previously. he was sleeping 3-4 hours.      Expected Outcomes  Short: Continue to work on [employer].  Long: Continue to cope well and work on stress.     Interventions  Encouraged to attend Cardiac Rehabilitation for the exercise;Stress management education    Continue Psychosocial Services   Follow up required by staff       Vocational Rehabilitation: Provide vocational rehab assistance to qualifying candidates.   Vocational Rehab Evaluation & Intervention: Vocational Rehab - 09/28/17 1209      Initial Vocational Rehab Evaluation & Intervention   Assessment shows need for Vocational Rehabilitation  No        Education: Education Goals: Education classes will be provided on a variety of topics geared toward better understanding of heart health and risk factor modification. Participant will state understanding/return demonstration of topics presented as noted by education test scores.  Learning Barriers/Preferences: Learning Barriers/Preferences - 09/28/17 1209      Learning Barriers/Preferences   Learning Barriers  None    Learning Preferences  None       Education Topics:  AED/CPR: -  Group verbal and written instruction with the use of models to demonstrate the basic use of the AED with the basic ABC's of resuscitation.   Cardiac Rehab from 11/02/2017 in [facility] Cardiac and Pulmonary Rehab  Date  10/07/17  Educator  [facility]  Instruction Review Code  1- Verbalizes Understanding      General Nutrition Guidelines/Fats and Fiber: -Group instruction provided by verbal, written material, models and posters to present the general guidelines for heart healthy nutrition. Gives an explanation and review of dietary fats and fiber.   Cardiac Rehab from 11/02/2017 in [facility] Cardiac and Pulmonary Rehab  Date  10/05/17  Educator  CR  Instruction Review Code  1- Verbalizes Understanding      Controlling Sodium/Reading Food Labels: -Group verbal and written material supporting the discussion of sodium use in heart healthy nutrition. Review and explanation with models, verbal and written materials for utilization of the food label.   Exercise Physiology & General Exercise Guidelines: - Group verbal and written instruction with models to review the exercise physiology of the cardiovascular system and associated critical values. Provides general exercise guidelines with specific guidelines to those with heart or lung disease.    Aerobic Exercise & Resistance Training: - Gives group verbal and written instruction on the various components of exercise. Focuses on aerobic and resistive training programs and  the benefits of this training and how to safely progress through these programs..   Cardiac Rehab from 11/02/2017 in [facility] Cardiac and Pulmonary Rehab  Date  11/02/17  Educator  AS  Instruction Review Code  1- Verbalizes Understanding      Flexibility, Balance, Mind/Body Relaxation: Provides group verbal/written instruction on the benefits of flexibility and balance training, including mind/body exercise modes such as yoga, pilates and tai chi.  Demonstration and skill practice provided.   Stress and Anxiety: - Provides group verbal and written instruction about the health risks of elevated stress and causes of high stress.  Discuss the correlation between heart/lung disease and anxiety and treatment options. Review healthy ways to manage with stress and anxiety.   Depression: - Provides group verbal and written instruction on the correlation between heart/lung disease and depressed mood, treatment options, and the stigmas associated with seeking treatment.   Anatomy & Physiology of the Heart: - Group verbal and written instruction and models provide basic cardiac anatomy and physiology, with the coronary electrical and arterial systems. Review of Valvular disease and Heart Failure   Cardiac Procedures: - Group verbal and written instruction to review commonly prescribed medications for heart disease. Reviews the medication, class of the drug, and side effects. Includes the steps to properly store meds and maintain the prescription regimen. (beta blockers and nitrates)   Cardiac Medications I: - Group verbal and written instruction to review commonly prescribed medications for heart disease. Reviews the medication, class of the drug, and side effects. Includes the steps to properly store meds and maintain the prescription regimen.   Cardiac Medications II: -Group verbal and written instruction to review commonly prescribed medications for heart disease. Reviews the medication, class of  the drug, and side effects. (all other drug classes)    Go Sex-Intimacy & Heart Disease, Get SMART - Goal Setting: - Group verbal and written instruction through game format to discuss heart disease and the return to sexual intimacy. Provides group verbal and written material to discuss and apply goal setting through the application of the S.M.A.R.T. Method.   Other Matters of the Heart: - Provides group verbal, written  materials and models to describe Stable Angina and Peripheral Artery. Includes description of the disease process and treatment options available to the cardiac patient.   Exercise & Equipment Safety: - Individual verbal instruction and demonstration of equipment use and safety with use of the equipment.   Cardiac Rehab from 11/02/2017 in [facility] Cardiac and Pulmonary Rehab  Date  09/28/17  Educator  [facility]  Instruction Review Code  1- Verbalizes Understanding      Infection Prevention: - Provides verbal and written material to individual with discussion of infection control including proper hand washing and proper equipment cleaning during exercise session.   Cardiac Rehab from 11/02/2017 in [facility] Cardiac and Pulmonary Rehab  Date  09/28/17  Educator  [facility]  Instruction Review Code  1- Verbalizes Understanding      Falls Prevention: - Provides verbal and written material to individual with discussion of falls prevention and safety.   Cardiac Rehab from 11/02/2017 in [facility] Cardiac and Pulmonary Rehab  Date  09/28/17  Educator  [facility]  Instruction Review Code  1- Verbalizes Understanding      Diabetes: - Individual verbal and written instruction to review signs/symptoms of diabetes, desired ranges of glucose level fasting, after meals and with exercise. Acknowledge that pre and post exercise glucose checks will be done for 3 sessions at entry of program.   Know Your Numbers and Risk Factors: -Group verbal and written instruction about important numbers in your health.   Discussion of what are risk factors and how they play a role in the disease process.  Review of Cholesterol, Blood Pressure, Diabetes, and BMI and the role they play in your overall health.   Sleep Hygiene: -Provides group verbal and written instruction about how sleep can affect your health.  Define sleep hygiene, discuss sleep cycles and impact of sleep habits. Review good sleep hygiene tips.    Cardiac Rehab from 11/02/2017 in [facility] Cardiac and Pulmonary Rehab  Date  09/30/17  Educator  [facility]  Instruction Review Code  1- Verbalizes Understanding      Other: -Provides group and verbal instruction on various topics (see comments)   Knowledge Questionnaire Score: Knowledge Questionnaire Score - 09/28/17 1046      Knowledge Questionnaire Score   Pre Score  24/26 Reviewed results with pt today.  Education focuses: Nutrition and Exercise       Core Components/Risk Factors/Patient Goals at Admission: Personal Goals and Risk Factors at Admission - 09/28/17 1204      Core Components/Risk Factors/Patient Goals on Admission    Weight Management  Yes;Weight Loss    Intervention  Weight Management: Develop a combined nutrition and exercise program designed to reach desired caloric intake, while maintaining appropriate intake of nutrient and fiber, sodium and fats, and appropriate energy expenditure required for the weight goal.;Weight Management: Provide education and appropriate resources to help participant work on and attain dietary goals.;Weight Management/Obesity: Establish reasonable short term and long term weight goals.    Admit Weight  175 lb (79.4 kg)    Goal Weight: Short Term  170 lb (77.1 kg)    Goal Weight: Long Term  170 lb (77.1 kg)    Expected Outcomes  Short Term: Continue to assess and modify interventions until short term weight is achieved;Long Term: Adherence to nutrition and physical activity/exercise program aimed toward attainment of established weight goal;Weight Loss:  Understanding of general recommendations for a balanced deficit meal plan, which promotes 1-2 lb weight loss per week and includes a negative energy balance  of [phone] kcal/d;Understanding recommendations for meals to include 15-35% energy as protein, 25-35% energy from fat, 35-60% energy from carbohydrates, less than 245m of dietary cholesterol, 20-35 gm of total fiber daily;Understanding of distribution of calorie intake throughout the day with the consumption of 4-5 meals/snacks    Tobacco Cessation  Yes Quit [place] information given    Number of packs per day  3/4 a pack    Intervention  Assist the participant in steps to quit. Provide individualized education and counseling about committing to Tobacco Cessation, relapse prevention, and pharmacological support that can be provided by physician.;OAdvice worker assist with locating and accessing local/national Quit Smoking programs, and support quit date choice.    Expected Outcomes  Short Term: Will demonstrate readiness to quit, by selecting a quit date.;Long Term: Complete abstinence from all tobacco products for at least 12 months from quit date.;Short Term: Will quit all tobacco product use, adhering to prevention of relapse plan.    Hypertension  Yes    Intervention  Provide education on lifestyle modifcations including regular physical activity/exercise, weight management, moderate sodium restriction and increased consumption of fresh fruit, vegetables, and low fat dairy, alcohol moderation, and smoking cessation.;Monitor prescription use compliance.    Expected Outcomes  Short Term: Continued assessment and intervention until BP is < 140/966mHG in hypertensive participants. < 130/8079mG in hypertensive participants with diabetes, heart failure or chronic kidney disease.;Long Term: Maintenance of blood pressure at goal levels.    Lipids  Yes    Intervention  Provide education and support for participant on nutrition &  aerobic/resistive exercise along with prescribed medications to achieve LDL <77m63mDL >40mg41m Expected Outcomes  Short Term: Participant states understanding of desired cholesterol values and is compliant with medications prescribed. Participant is following exercise prescription and nutrition guidelines.;Long Term: Cholesterol controlled with medications as prescribed, with individualized exercise RX and with personalized nutrition plan. Value goals: LDL < 77mg,59m > 40 mg.       Core Components/Risk Factors/Patient Goals Review:  Goals and Risk Factor Review    Row Name 10/05/17 0852 [id number]           Core Components/Risk Factors/Patient Goals Review   Personal Goals Review  Tobacco Cessation  Weight Management/Obesity;Tobacco Cessation;Hypertension;Lipids      Review  Keith Lanny Hurstterested in quitting smoking for good. He is down to 5 cigarettes in last 24 hours.  We talked some about it today and he is ready to quit.  Gave him quit smoking packet and fake cigarette to use/hold.  Also talked about combination therapy with gum that he is already using and patches. He would like to avoid using Chantix if possible.  We will talk about setting a quit date later this week as he tries the fake cigarette and look into using patches with gum.   Keith's weight has been holding steady around 184.  He just wants to continue to work towards building more muscle.  He has been doing pretty well with blood pressures, today it was little higher after a cup of coffee.  Keith Lanny Hurst his blood pressures at least twice a week at home.  He is doing well on his medications.   He is still at 5-6 cigarettes a day.  He is not sure what the hang up is for him.  He is thinking about the Chantix more, but has yet to try the patches.  He has to make the decison to quit.  He  can make it a day without a cigarette but eventually gets one.        Expected Outcomes  Short: Set quit date.  Long: Continue to remain tobacco  free  Short: Decide about Chantix.  Long: Continue to work on risk factors.          Core Components/Risk Factors/Patient Goals at Discharge (Final Review):  Goals and Risk Factor Review - 10/16/17 0806      Core Components/Risk Factors/Patient Goals Review   Personal Goals Review  Weight Management/Obesity;Tobacco Cessation;Hypertension;Lipids    Review  Keith's weight has been holding steady around 184.  He just wants to continue to work towards building more muscle.  He has been doing pretty well with blood pressures, today it was little higher after a cup of coffee.  Lanny Hurst check his blood pressures at least twice a week at home.  He is doing well on his medications.   He is still at 5-6 cigarettes a day.  He is not sure what the hang up is for him.  He is thinking about the Chantix more, but has yet to try the patches.  He has to make the decison to quit.  He can make it a day without a cigarette but eventually gets one.      Expected Outcomes  Short: Decide about Chantix.  Long: Continue to work on risk factors.        ITP Comments: ITP Comments    Row Name 09/28/17 1202 10/07/17 0534 11/04/17 0619 11/27/17 0951 12/02/17 0606   ITP Comments  Med Review completed. Initial ITP created. Diagnosis can be found in [facility] 08/19/17  30 day review. Continue with ITP unless directed changes per Medical Director New to program  30 day review. Continue with ITP unless directed changes per Medical Director review  2 visits in June  Lanny Hurst has not attended since last review - LMOM about returning   30 day review. Continue with ITP unless directed changes per Medical Director review.  Last visit 11/02/2017      Comments: 30 day review. Continue with ITP unless directed changes per Medical Director review.

## 2017-12-04 NOTE — Progress Notes (Signed)
Cardiac Individual Treatment Plan  Patient Details  Name: LINN GOETZE MRN: [id number] Date of Birth: [birth year] Referring Provider:     Cardiac Rehab from 09/28/2017 in [facility] Cardiac and Pulmonary Rehab  Referring Provider  End, [name] MD      Initial Encounter Date:    Cardiac Rehab from 09/28/2017 in [facility] Cardiac and Pulmonary Rehab  Date  09/28/17      Visit Diagnosis: No diagnosis found.  Patient's Home Medications on Admission:  Current Outpatient Medications:  .  aspirin 81 MG chewable tablet, Chew 1 tablet (81 mg total) by mouth daily., Disp: 30 tablet, Rfl: 2 .  atorvastatin (LIPITOR) 80 MG tablet, Take 1 tablet (80 mg total) by mouth daily at 6 PM., Disp: 90 tablet, Rfl: 3 .  carvedilol (COREG) 6.25 MG tablet, Take 1 tablet (6.25 mg total) by mouth 2 (two) times daily with a meal., Disp: 180 tablet, Rfl: 3 .  citalopram (CELEXA) 20 MG tablet, TAKE 1 TABLET (20 MG TOTAL) BY MOUTH DAILY., Disp: 90 tablet, Rfl: 3 .  losartan (COZAAR) 50 MG tablet, Take 1 tablet (50 mg total) by mouth daily., Disp: 90 tablet, Rfl: 3 .  nitroGLYCERIN (NITROSTAT) 0.4 MG SL tablet, Place 1 tablet (0.4 mg total) under the tongue every 5 (five) minutes as needed for chest pain., Disp: 20 tablet, Rfl: 2 .  ticagrelor (BRILINTA) 90 MG TABS tablet, Take 1 tablet (90 mg total) by mouth 2 (two) times daily., Disp: 180 tablet, Rfl: 3 .  varenicline (CHANTIX CONTINUING MONTH PAK) 1 MG tablet, Take 1 tablet (1 mg total) by mouth 2 (two) times daily., Disp: 180 tablet, Rfl: 0 .  varenicline (CHANTIX STARTING MONTH PAK) 0.5 MG X 11 & 1 MG X 42 tablet, Take one 0.5 mg tablet by mouth once daily for 3 days, then increase to one 0.5 mg tablet twice daily for 4 days, then increase to one 1 mg tablet twice daily., Disp: 53 tablet, Rfl: 0  Past Medical History: Past Medical History:  Diagnosis Date  . Anxiety   . CAD (coronary artery disease)    a. 08/2017 NSTEMI/PCI: LM nl, LAD 30ost, 77m D1 50ost, RI  nl, LCX small/nl, RCA 70p/[id number] [place]), EF 65%.  . History of echocardiogram    a. 08/2017 Echo: EF 65-70%, no rwma, mildly dil LA.  . Marland Kitchenyperlipidemia   . Hypertension   . Tobacco abuse     Tobacco Use: Social History   Tobacco Use  Smoking Status Current Every Day Smoker  . Packs/day: 0.25  . Years: 20.00  . Pack years: 5.00  . Types: Cigarettes  Smokeless Tobacco Former User  Tobacco Comment   09/30/17 down to cigarettes in last 24 hrs    Labs: Recent Review Flowsheet Data    Labs for ITP Cardiac and Pulmonary Rehab Latest Ref Rng & Units 08/21/2017 11/25/2017   Cholestrol 100 - 199 mg/dL 192 129   LDLCALC 0 - 99 mg/dL 94 56   HDL >39 mg/dL 47 44   Trlycerides 0 - 149 mg/dL 254(H) 143       Exercise Target Goals:    Exercise Program Goal: Individual exercise prescription set using results from initial 6 min walk test and THRR while considering  patient's activity barriers and safety.   Exercise Prescription Goal: Initial exercise prescription builds to 30-45 minutes a day of aerobic activity, 2-3 days per week.  Home exercise guidelines will be given to patient during program as part of exercise  prescription that the participant will acknowledge.  Activity Barriers & Risk Stratification: Activity Barriers & Cardiac Risk Stratification - 09/28/17 1210      Activity Barriers & Cardiac Risk Stratification   Activity Barriers  Deconditioning    Cardiac Risk Stratification  Moderate       6 Minute Walk: 6 Minute Walk    Row Name 09/28/17 1232         6 Minute Walk   Phase  Initial     Distance  1720 feet     Walk Time  6 minutes     # of Rest Breaks  0     MPH  3.26     METS  5.44     RPE  8     VO2 Peak  19.04     Symptoms  No     Resting HR  53 bpm     Resting BP  122/70     Resting Oxygen Saturation   97 %     Exercise Oxygen Saturation  during 6 min walk  99 %     Max Ex. HR  109 bpm     Max Ex. BP  146/64     2 Minute Post BP  124/70          Oxygen Initial Assessment:   Oxygen Re-Evaluation:   Oxygen Discharge (Final Oxygen Re-Evaluation):   Initial Exercise Prescription: Initial Exercise Prescription - 09/28/17 1200      Date of Initial Exercise RX and Referring Provider   Date  09/28/17    Referring Provider  End, [name] MD      Treadmill   MPH  3.5    Grade  3.5    Minutes  15    METs  5.37      Elliptical   Level  2    Speed  5.8    Minutes  15      T5 Nustep   Level  5    SPM  100    Minutes  15    METs  4      Prescription Details   Frequency (times per week)  3    Duration  Progress to 45 minutes of aerobic exercise without signs/symptoms of physical distress      Intensity   THRR 40-80% of Max Heartrate  102-151    Ratings of Perceived Exertion  11-13    Perceived Dyspnea  0-4      Progression   Progression  Continue to progress workloads to maintain intensity without signs/symptoms of physical distress.      Resistance Training   Training Prescription  Yes    Weight  5 lbs    Reps  10-15       Perform Capillary Blood Glucose checks as needed.  Exercise Prescription Changes: Exercise Prescription Changes    Row Name 09/28/17 1200 10/05/17 0800 10/14/17 0800 11/13/17 0900       Response to Exercise   Blood Pressure (Admit)  122/70  -  122/70  122/80    Blood Pressure (Exercise)  146/64  -  148/70  128/68    Blood Pressure (Exit)  124/70  -  122/70  104/50    Heart Rate (Admit)  53 bpm  -  82 bpm  59 bpm    Heart Rate (Exercise)  109 bpm  -  122 bpm  114 bpm    Heart Rate (Exit)  61 bpm  -  72  bpm  58 bpm    Oxygen Saturation (Admit)  97 %  -  -  -    Oxygen Saturation (Exercise)  99 %  -  -  -    Rating of Perceived Exertion (Exercise)  8  -  12  12    Symptoms  none  -  none  none    Comments  walk test results  -  -  -    Duration  -  -  Continue with 45 min of aerobic exercise without signs/symptoms of physical distress.  Continue with 45 min of aerobic  exercise without signs/symptoms of physical distress.    Intensity  -  -  THRR unchanged  THRR unchanged      Progression   Progression  -  -  Continue to progress workloads to maintain intensity without signs/symptoms of physical distress.  Continue to progress workloads to maintain intensity without signs/symptoms of physical distress.    Average METs  -  -  5.79  6      Resistance Training   Training Prescription  -  -  Yes  Yes    Weight  -  -  5 lbs  5 lb    Reps  -  -  10-15  10-15      Interval Training   Interval Training  -  -  No  No      Treadmill   MPH  -  -  4  4    Grade  -  -  4  7    Minutes  -  -  15  15    METs  -  -  6.27  7.92      Elliptical   Level  -  -  2  -    Speed  -  -  5.8  -    Minutes  -  -  15  -      T5 Nustep   Level  -  -  5  5    Minutes  -  -  15  15    METs  -  -  5.3  4.1      Home Exercise Plan   Plans to continue exercise at  -  [employer] (comment) walking and [employer] (comment) walking and [employer] (comment) walking and [employer]    Frequency  -  Add 3 additional days to program exercise sessions.  Add 3 additional days to program exercise sessions.  Add 3 additional days to program exercise sessions.    Initial Home Exercises Provided  -  10/05/17  10/05/17  10/05/17       Exercise Comments: Exercise Comments    Row Name 09/30/17 0845           Exercise Comments  First full day of exercise!  Patient was oriented to gym and equipment including functions, settings, policies, and procedures.  Patient's individual exercise prescription and treatment plan were reviewed.  All starting workloads were established based on the results of the 6 minute walk test done at initial orientation visit.  The plan for exercise progression was also introduced and progression will be customized based on patient's performance and goals.          Exercise Goals and  Review: Exercise Goals    Row Name 09/28/17 1235  Exercise Goals   Increase Physical Activity  Yes       Intervention  Provide advice, education, support and counseling about physical activity/exercise needs.;Develop an individualized exercise prescription for aerobic and resistive training based on initial evaluation findings, risk stratification, comorbidities and participant's personal goals.       Expected Outcomes  Short Term: Attend rehab on a regular basis to increase amount of physical activity.;Long Term: Add in home exercise to make exercise part of routine and to increase amount of physical activity.;Long Term: Exercising regularly at least 3-5 days a week.       Increase Strength and Stamina  Yes       Intervention  Provide advice, education, support and counseling about physical activity/exercise needs.;Develop an individualized exercise prescription for aerobic and resistive training based on initial evaluation findings, risk stratification, comorbidities and participant's personal goals.       Expected Outcomes  Short Term: Increase workloads from initial exercise prescription for resistance, speed, and METs.;Short Term: Perform resistance training exercises routinely during rehab and add in resistance training at home;Long Term: Improve cardiorespiratory fitness, muscular endurance and strength as measured by increased METs and functional capacity (6MWT)       Able to understand and use rate of perceived exertion (RPE) scale  Yes       Intervention  Provide education and explanation on how to use RPE scale       Expected Outcomes  Short Term: Able to use RPE daily in rehab to express subjective intensity level;Long Term:  Able to use RPE to guide intensity level when exercising independently       Knowledge and understanding of Target Heart Rate Range (THRR)  Yes       Intervention  Provide education and explanation of THRR including how the numbers were predicted and  where they are located for reference       Expected Outcomes  Short Term: Able to state/look up THRR;Short Term: Able to use daily as guideline for intensity in rehab;Long Term: Able to use THRR to govern intensity when exercising independently       Able to check pulse independently  Yes       Intervention  Provide education and demonstration on how to check pulse in carotid and radial arteries.;Review the importance of being able to check your own pulse for safety during independent exercise       Expected Outcomes  Short Term: Able to explain why pulse checking is important during independent exercise;Long Term: Able to check pulse independently and accurately       Understanding of Exercise Prescription  Yes       Intervention  Provide education, explanation, and written materials on patient's individual exercise prescription       Expected Outcomes  Short Term: Able to explain program exercise prescription;Long Term: Able to explain home exercise prescription to exercise independently          Exercise Goals Re-Evaluation : Exercise Goals Re-Evaluation    Row Name 09/30/17 0845 10/05/17 0853 10/14/17 0814 10/16/17 0802 11/13/17 0939     Exercise Goal Re-Evaluation   Exercise Goals Review  Increase Physical Activity;Able to understand and use rate of perceived exertion (RPE) scale;Knowledge and understanding of Target Heart Rate Range (THRR);Understanding of Exercise Prescription  Increase Physical Activity;Able to understand and use rate of perceived exertion (RPE) scale;Knowledge and understanding of Target Heart Rate Range (THRR);Increase Strength and Stamina;Able to check pulse independently;Understanding of Exercise Prescription  Increase Physical  Activity;Understanding of Exercise Prescription;Increase Strength and Stamina  Increase Physical Activity;Understanding of Exercise Prescription;Increase Strength and Stamina  Increase Physical Activity;Able to understand and use rate of perceived  exertion (RPE) scale;Knowledge and understanding of Target Heart Rate Range (THRR);Increase Strength and Stamina   Comments  Reviewed RPE scale, THR and program prescription with pt today.  Pt voiced understanding and was given a copy of goals to take home.   Reviewed home exercise with pt today.  Pt plans to walk at home and go to [employer] for exercise.  Reviewed THR, pulse, RPE, sign and symptoms, NTG use, and when to call 911 or MD.  Also discussed weather considerations and indoor options.  Pt voiced understanding.  Lanny Hurst is off to a good start in rehab.  He is already up to 4 mph and 4% grade on the treadmill and will be starting intervals this week. We will continue to monitor his progression.   Lanny Hurst is doing well in rehab.  He is getting in two days a week at the gym doing the same thing we do in class for 45mn.  He has not gotten back to doing his weights yet.  He will try to get back to weight and back into habit of going to gym.  He does feel like his strength and stamia are getting better. KLanny Hursthas started to add in intervals on his machines.  Pt is reaching HR and RPE goals. Staff will monitor progress   Expected Outcomes  Short: Use RPE daily to regulate intensity.  Long: Follow program prescription in THR.  Short: Return to gym 2-3 days a week.  Long: Continue to exercise independently.   Short: Start interval training.  Long: Continue to exercise on off days.   Short: Continue to work on intervals and add weights back in.   Long: Continue to exercise independently  Short - attend regularly Long - maintain MET increase      Discharge Exercise Prescription (Final Exercise Prescription Changes): Exercise Prescription Changes - 11/13/17 0900      Response to Exercise   Blood Pressure (Admit)  122/80    Blood Pressure (Exercise)  128/68    Blood Pressure (Exit)  104/50    Heart Rate (Admit)  59 bpm    Heart Rate (Exercise)  114 bpm    Heart Rate (Exit)  58 bpm    Rating of  Perceived Exertion (Exercise)  12    Symptoms  none    Duration  Continue with 45 min of aerobic exercise without signs/symptoms of physical distress.    Intensity  THRR unchanged      Progression   Progression  Continue to progress workloads to maintain intensity without signs/symptoms of physical distress.    Average METs  6      Resistance Training   Training Prescription  Yes    Weight  5 lb    Reps  10-15      Interval Training   Interval Training  No      Treadmill   MPH  4    Grade  7    Minutes  15    METs  7.92      T5 Nustep   Level  5    Minutes  15    METs  4.1      Home Exercise Plan   Plans to continue exercise at  [employer](comment[facility] walking and [employer]   Frequency  Add 3 additional days  to program exercise sessions.    Initial Home Exercises Provided  10/05/17       Nutrition:  Target Goals: Understanding of nutrition guidelines, daily intake of sodium <1566m, cholesterol <2035m calories 30% from fat and 7% or less from saturated fats, daily to have 5 or more servings of fruits and vegetables.  Biometrics: Pre Biometrics - 09/28/17 1236      Pre Biometrics   Height  5' 9.7" (1.77 m)    Weight  174 lb 12.8 oz (79.3 kg)    Waist Circumference  34 inches    Hip Circumference  36 inches    Waist to Hip Ratio  0.94 %    BMI (Calculated)  25.31    Single Leg Stand  30 seconds        Nutrition Therapy Plan and Nutrition Goals: Nutrition Therapy & Goals - 11/12/17 1447      Nutrition Therapy   RD appointment deferred  -- Patient was scheduled to meet with RD 10/26/17 but did not show       Nutrition Assessments: Nutrition Assessments - 09/28/17 1046      MEDFICTS Scores   Pre Score  11       Nutrition Goals Re-Evaluation: Nutrition Goals Re-Evaluation    [place] 10/16/17 0810             Goals   Nutrition Goal  Meet with dieitician.       Comment  He continues to struggle with eating since he does not have a  kitchen.  He is getting tired of eating out and eating chicken.  We made him an appointment with nutritionist.  He is supposed to get his kitchen applianes on June 7.       Expected Outcome  Short: Meet with dietician.  Long: Find new options to eat.           Nutrition Goals Discharge (Final Nutrition Goals Re-Evaluation): Nutrition Goals Re-Evaluation - 10/16/17 0810      Goals   Nutrition Goal  Meet with dieitician.    Comment  He continues to struggle with eating since he does not have a kitchen.  He is getting tired of eating out and eating chicken.  We made him an appointment with nutritionist.  He is supposed to get his kitchen applianes on June 7.    Expected Outcome  Short: Meet with dietician.  Long: Find new options to eat.        Psychosocial: Target Goals: Acknowledge presence or absence of significant depression and/or stress, maximize coping skills, provide positive support system. Participant is able to verbalize types and ability to use techniques and skills needed for reducing stress and depression.   Initial Review & Psychosocial Screening: Initial Psych Review & Screening - 09/28/17 1207      Initial Review   Current issues with  Current Stress Concerns;Current Sleep Concerns He has a hard time getting to bed due to no set routine    Source of Stress Concerns  Occupation    Comments  KeLanny Hursteports a having a stressful job, but he is "used to it" after 10 years. His boss has been very accommodating with his heart attack.       Family Dynamics   Good Support System?  Yes family      Barriers   Psychosocial barriers to participate in program  There are no identifiable barriers or psychosocial needs.;The patient should benefit from training in stress management and relaxation.  Screening Interventions   Interventions  Encouraged to exercise;Provide feedback about the scores to participant;Program counselor consult;To provide support and resources with identified  psychosocial needs    Expected Outcomes  Short Term goal: Utilizing psychosocial counselor, staff and physician to assist with identification of specific Stressors or current issues interfering with healing process. Setting desired goal for each stressor or current issue identified.;Long Term Goal: Stressors or current issues are controlled or eliminated.;Short Term goal: Identification and review with participant of any Quality of Life or Depression concerns found by scoring the questionnaire.;Long Term goal: The participant improves quality of Life and PHQ9 Scores as seen by post scores and/or verbalization of changes       Quality of Life Scores:  Quality of Life - 09/28/17 1046      Quality of Life Scores   Health/Function Pre  28.9 %    Socioeconomic Pre  30 %    Psych/Spiritual Pre  30 %    Family Pre  30 %    GLOBAL Pre  29.51 %      Scores of 19 and below usually indicate a poorer quality of life in these areas.  A difference of  2-3 points is a clinically meaningful difference.  A difference of 2-3 points in the total score of the Quality of Life Index has been associated with significant improvement in overall quality of life, self-image, physical symptoms, and general health in studies assessing change in quality of life.  PHQ-9: Recent Review Flowsheet Data    Depression screen [facility] 2/9 09/28/2017 11/11/2016 05/28/2016   Decreased Interest 0 1 0   Down, Depressed, Hopeless 0 1 0   PHQ - 2 Score 0 2 0   Altered sleeping 1 0 -   Tired, decreased energy 0 2 -   Change in appetite 1 0 -   Feeling bad or failure about yourself  0 0 -   Trouble concentrating 0 0 -   Moving slowly or fidgety/restless 0 0 -   Suicidal thoughts 0 0 -   PHQ-9 Score 2 4 -   Difficult doing work/chores Not difficult at all Somewhat difficult  -     Interpretation of Total Score  Total Score Depression Severity:  1-4 = Minimal depression, 5-9 = Mild depression, 10-14 = Moderate depression, 15-19 =  Moderately severe depression, 20-27 = Severe depression   Psychosocial Evaluation and Intervention: Psychosocial Evaluation - 10/07/17 0859      Psychosocial Evaluation & Interventions   Interventions  Encouraged to exercise with the program and follow exercise prescription;Relaxation education;Stress management education    Comments  Counselor met with Mr. Yurchak Lanny Hurst) for initial psychosocial evaluation.  He is a 47 year old who had a heart attack and a stent inserted on 4/3.  Keith's parents live locally and they are his primary support system.  He reports being in fairly good health; sleeping better since the surgery; and his appetite is challenging wondering what he can and cannot eat now.  He has a history of anxiety and has been on medications for approximately one year - which he reports are helping.  He denies any depression and states he is typically in a positive mood now.  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  Also, his eating habits have become stressful as he is remodeling his kitchen and does not have the ability to cook healthy foods  currently.  He has goals to lose weight; tone up and eat healthier.  He has a Building services engineer that he will continue using upon completion of this program.  Staff will follow with Lanny Hurst.    Expected Outcomes  Short:  Lanny Hurst will meet with the dietician and attend nutrition education to meet his healthy weight/diet goal.   Long:  Lanny Hurst will exercise consistently for his heart health as well as for stress management.      Continue Psychosocial Services   Follow up required by staff       Psychosocial Re-Evaluation: Psychosocial Re-Evaluation    [place] Name 10/16/17 0815             Psychosocial Re-Evaluation   Current issues with  Current Stress Concerns       Comments  He is finishing his house to get ready to sell and move closer to work in [place].  He doesn't want  to drive 50 miles to work.  On Monday's he is able to work from home and go in on Wednesday and Friday.  His job allows him flexibilty.  He will be finishing his kitchen soon.   The disarray is making him stressed.   Lanny Hurst has been sleeping good about 6-8 hours each night.  Previously. he was sleeping 3-4 hours.         Expected Outcomes  Short: Continue to work on [employer].  Long: Continue to cope well and work on stress.        Interventions  Encouraged to attend Cardiac Rehabilitation for the exercise;Stress management education       Continue Psychosocial Services   Follow up required by staff          Psychosocial Discharge (Final Psychosocial Re-Evaluation): Psychosocial Re-Evaluation - 10/16/17 0815      Psychosocial Re-Evaluation   Current issues with  Current Stress Concerns    Comments  He is finishing his house to get ready to sell and move closer to work in [place].  He doesn't want to drive 50 miles to work.  On Monday's he is able to work from home and go in on Wednesday and Friday.  His job allows him flexibilty.  He will be finishing his kitchen soon.   The disarray is making him stressed.   Lanny Hurst has been sleeping good about 6-8 hours each night.  Previously. he was sleeping 3-4 hours.      Expected Outcomes  Short: Continue to work on [employer].  Long: Continue to cope well and work on stress.     Interventions  Encouraged to attend Cardiac Rehabilitation for the exercise;Stress management education    Continue Psychosocial Services   Follow up required by staff       Vocational Rehabilitation: Provide vocational rehab assistance to qualifying candidates.   Vocational Rehab Evaluation & Intervention: Vocational Rehab - 09/28/17 1209      Initial Vocational Rehab Evaluation & Intervention   Assessment shows need for Vocational Rehabilitation  No       Education: Education Goals: Education classes will be provided on a variety of topics geared toward  better understanding of heart health and risk factor modification. Participant will state understanding/return demonstration of topics presented as noted by education test scores.  Learning Barriers/Preferences: Learning Barriers/Preferences - 09/28/17 1209      Learning Barriers/Preferences   Learning Barriers  None    Learning Preferences  None       Education Topics:  AED/CPR: -  Group verbal and written instruction with the use of models to demonstrate the basic use of the AED with the basic ABC's of resuscitation.   Cardiac Rehab from 11/02/2017 in [facility] Cardiac and Pulmonary Rehab  Date  10/07/17  Educator  [facility]  Instruction Review Code  1- Verbalizes Understanding      General Nutrition Guidelines/Fats and Fiber: -Group instruction provided by verbal, written material, models and posters to present the general guidelines for heart healthy nutrition. Gives an explanation and review of dietary fats and fiber.   Cardiac Rehab from 11/02/2017 in [facility] Cardiac and Pulmonary Rehab  Date  10/05/17  Educator  CR  Instruction Review Code  1- Verbalizes Understanding      Controlling Sodium/Reading Food Labels: -Group verbal and written material supporting the discussion of sodium use in heart healthy nutrition. Review and explanation with models, verbal and written materials for utilization of the food label.   Exercise Physiology & General Exercise Guidelines: - Group verbal and written instruction with models to review the exercise physiology of the cardiovascular system and associated critical values. Provides general exercise guidelines with specific guidelines to those with heart or lung disease.    Aerobic Exercise & Resistance Training: - Gives group verbal and written instruction on the various components of exercise. Focuses on aerobic and resistive training programs and the benefits of this training and how to safely progress through these programs..   Cardiac Rehab from  11/02/2017 in [facility] Cardiac and Pulmonary Rehab  Date  11/02/17  Educator  AS  Instruction Review Code  1- Verbalizes Understanding      Flexibility, Balance, Mind/Body Relaxation: Provides group verbal/written instruction on the benefits of flexibility and balance training, including mind/body exercise modes such as yoga, pilates and tai chi.  Demonstration and skill practice provided.   Stress and Anxiety: - Provides group verbal and written instruction about the health risks of elevated stress and causes of high stress.  Discuss the correlation between heart/lung disease and anxiety and treatment options. Review healthy ways to manage with stress and anxiety.   Depression: - Provides group verbal and written instruction on the correlation between heart/lung disease and depressed mood, treatment options, and the stigmas associated with seeking treatment.   Anatomy & Physiology of the Heart: - Group verbal and written instruction and models provide basic cardiac anatomy and physiology, with the coronary electrical and arterial systems. Review of Valvular disease and Heart Failure   Cardiac Procedures: - Group verbal and written instruction to review commonly prescribed medications for heart disease. Reviews the medication, class of the drug, and side effects. Includes the steps to properly store meds and maintain the prescription regimen. (beta blockers and nitrates)   Cardiac Medications I: - Group verbal and written instruction to review commonly prescribed medications for heart disease. Reviews the medication, class of the drug, and side effects. Includes the steps to properly store meds and maintain the prescription regimen.   Cardiac Medications II: -Group verbal and written instruction to review commonly prescribed medications for heart disease. Reviews the medication, class of the drug, and side effects. (all other drug classes)    Go Sex-Intimacy & Heart Disease, Get SMART  - Goal Setting: - Group verbal and written instruction through game format to discuss heart disease and the return to sexual intimacy. Provides group verbal and written material to discuss and apply goal setting through the application of the S.M.A.R.T. Method.   Other Matters of the Heart: - Provides group verbal, written  materials and models to describe Stable Angina and Peripheral Artery. Includes description of the disease process and treatment options available to the cardiac patient.   Exercise & Equipment Safety: - Individual verbal instruction and demonstration of equipment use and safety with use of the equipment.   Cardiac Rehab from 11/02/2017 in [facility] Cardiac and Pulmonary Rehab  Date  09/28/17  Educator  [facility]  Instruction Review Code  1- Verbalizes Understanding      Infection Prevention: - Provides verbal and written material to individual with discussion of infection control including proper hand washing and proper equipment cleaning during exercise session.   Cardiac Rehab from 11/02/2017 in [facility] Cardiac and Pulmonary Rehab  Date  09/28/17  Educator  [facility]  Instruction Review Code  1- Verbalizes Understanding      Falls Prevention: - Provides verbal and written material to individual with discussion of falls prevention and safety.   Cardiac Rehab from 11/02/2017 in [facility] Cardiac and Pulmonary Rehab  Date  09/28/17  Educator  [facility]  Instruction Review Code  1- Verbalizes Understanding      Diabetes: - Individual verbal and written instruction to review signs/symptoms of diabetes, desired ranges of glucose level fasting, after meals and with exercise. Acknowledge that pre and post exercise glucose checks will be done for 3 sessions at entry of program.   Know Your Numbers and Risk Factors: -Group verbal and written instruction about important numbers in your health.  Discussion of what are risk factors and how they play a role in the disease process.  Review of Cholesterol,  Blood Pressure, Diabetes, and BMI and the role they play in your overall health.   Sleep Hygiene: -Provides group verbal and written instruction about how sleep can affect your health.  Define sleep hygiene, discuss sleep cycles and impact of sleep habits. Review good sleep hygiene tips.    Cardiac Rehab from 11/02/2017 in [facility] Cardiac and Pulmonary Rehab  Date  09/30/17  Educator  [facility]  Instruction Review Code  1- Verbalizes Understanding      Other: -Provides group and verbal instruction on various topics (see comments)   Knowledge Questionnaire Score: Knowledge Questionnaire Score - 09/28/17 1046      Knowledge Questionnaire Score   Pre Score  24/26 Reviewed results with pt today.  Education focuses: Nutrition and Exercise       Core Components/Risk Factors/Patient Goals at Admission: Personal Goals and Risk Factors at Admission - 09/28/17 1204      Core Components/Risk Factors/Patient Goals on Admission    Weight Management  Yes;Weight Loss    Intervention  Weight Management: Develop a combined nutrition and exercise program designed to reach desired caloric intake, while maintaining appropriate intake of nutrient and fiber, sodium and fats, and appropriate energy expenditure required for the weight goal.;Weight Management: Provide education and appropriate resources to help participant work on and attain dietary goals.;Weight Management/Obesity: Establish reasonable short term and long term weight goals.    Admit Weight  175 lb (79.4 kg)    Goal Weight: Short Term  170 lb (77.1 kg)    Goal Weight: Long Term  170 lb (77.1 kg)    Expected Outcomes  Short Term: Continue to assess and modify interventions until short term weight is achieved;Long Term: Adherence to nutrition and physical activity/exercise program aimed toward attainment of established weight goal;Weight Loss: Understanding of general recommendations for a balanced deficit meal plan, which promotes 1-2 lb weight loss per  week and includes a negative energy balance  of [phone] kcal/d;Understanding recommendations for meals to include 15-35% energy as protein, 25-35% energy from fat, 35-60% energy from carbohydrates, less than 275m of dietary cholesterol, 20-35 gm of total fiber daily;Understanding of distribution of calorie intake throughout the day with the consumption of 4-5 meals/snacks    Tobacco Cessation  Yes Quit [place] information given    Number of packs per day  3/4 a pack    Intervention  Assist the participant in steps to quit. Provide individualized education and counseling about committing to Tobacco Cessation, relapse prevention, and pharmacological support that can be provided by physician.;OAdvice worker assist with locating and accessing local/national Quit Smoking programs, and support quit date choice.    Expected Outcomes  Short Term: Will demonstrate readiness to quit, by selecting a quit date.;Long Term: Complete abstinence from all tobacco products for at least 12 months from quit date.;Short Term: Will quit all tobacco product use, adhering to prevention of relapse plan.    Hypertension  Yes    Intervention  Provide education on lifestyle modifcations including regular physical activity/exercise, weight management, moderate sodium restriction and increased consumption of fresh fruit, vegetables, and low fat dairy, alcohol moderation, and smoking cessation.;Monitor prescription use compliance.    Expected Outcomes  Short Term: Continued assessment and intervention until BP is < 140/955mHG in hypertensive participants. < 130/8020mG in hypertensive participants with diabetes, heart failure or chronic kidney disease.;Long Term: Maintenance of blood pressure at goal levels.    Lipids  Yes    Intervention  Provide education and support for participant on nutrition & aerobic/resistive exercise along with prescribed medications to achieve LDL <27m48mDL >40mg27m Expected Outcomes  Short  Term: Participant states understanding of desired cholesterol values and is compliant with medications prescribed. Participant is following exercise prescription and nutrition guidelines.;Long Term: Cholesterol controlled with medications as prescribed, with individualized exercise RX and with personalized nutrition plan. Value goals: LDL < 27mg,89m > 40 mg.       Core Components/Risk Factors/Patient Goals Review:  Goals and Risk Factor Review    Row Name 10/05/17 0852 [id number]           Core Components/Risk Factors/Patient Goals Review   Personal Goals Review  Tobacco Cessation  Weight Management/Obesity;Tobacco Cessation;Hypertension;Lipids      Review  Keith Lanny Hurstterested in quitting smoking for good. He is down to 5 cigarettes in last 24 hours.  We talked some about it today and he is ready to quit.  Gave him quit smoking packet and fake cigarette to use/hold.  Also talked about combination therapy with gum that he is already using and patches. He would like to avoid using Chantix if possible.  We will talk about setting a quit date later this week as he tries the fake cigarette and look into using patches with gum.   Keith's weight has been holding steady around 184.  He just wants to continue to work towards building more muscle.  He has been doing pretty well with blood pressures, today it was little higher after a cup of coffee.  Keith Lanny Hurst his blood pressures at least twice a week at home.  He is doing well on his medications.   He is still at 5-6 cigarettes a day.  He is not sure what the hang up is for him.  He is thinking about the Chantix more, but has yet to try the patches.  He has to make the decison to quit.  He  can make it a day without a cigarette but eventually gets one.        Expected Outcomes  Short: Set quit date.  Long: Continue to remain tobacco free  Short: Decide about Chantix.  Long: Continue to work on risk factors.          Core Components/Risk Factors/Patient  Goals at Discharge (Final Review):  Goals and Risk Factor Review - 10/16/17 0806      Core Components/Risk Factors/Patient Goals Review   Personal Goals Review  Weight Management/Obesity;Tobacco Cessation;Hypertension;Lipids    Review  Keith's weight has been holding steady around 184.  He just wants to continue to work towards building more muscle.  He has been doing pretty well with blood pressures, today it was little higher after a cup of coffee.  Lanny Hurst check his blood pressures at least twice a week at home.  He is doing well on his medications.   He is still at 5-6 cigarettes a day.  He is not sure what the hang up is for him.  He is thinking about the Chantix more, but has yet to try the patches.  He has to make the decison to quit.  He can make it a day without a cigarette but eventually gets one.      Expected Outcomes  Short: Decide about Chantix.  Long: Continue to work on risk factors.        ITP Comments: ITP Comments    Row Name 09/28/17 1202 10/07/17 0534 11/04/17 0619 11/27/17 0951 12/02/17 0606   ITP Comments  Med Review completed. Initial ITP created. Diagnosis can be found in [facility] 08/19/17  30 day review. Continue with ITP unless directed changes per Medical Director New to program  30 day review. Continue with ITP unless directed changes per Medical Director review  2 visits in June  Lanny Hurst has not attended since last review - LMOM about returning   30 day review. Continue with ITP unless directed changes per Medical Director review.  Last visit 11/02/2017      Comments:discharge ITP

## 2017-12-04 NOTE — Progress Notes (Signed)
Discharge Progress Report  Patient Details  Name: Nathan Gray MRN: [id number] Date of Birth: 1970-07-30 Referring Provider:     Cardiac Rehab from 09/28/2017 in [facility]  Referring Provider  End, Cristal Deer MD       Number of Visits: 17  Reason for Discharge:  Early Exit:  Personal  Smoking History:  Social History   Tobacco Use  Smoking Status Current Every Day Smoker  . Packs/day: 0.25  . Years: 20.00  . Pack years: 5.00  . Types: Cigarettes  Smokeless Tobacco Former User  Tobacco Comment   09/30/17 down to cigarettes in last 24 hrs    Diagnosis:  No diagnosis found.  ADL [facility]:   Initial Exercise Prescription: Initial Exercise Prescription - 09/28/17 1200      Date of Initial Exercise RX and Referring Provider   Date  09/28/17    Referring Provider  End, Cristal Deer MD      Treadmill   MPH  3.5    Grade  3.5    Minutes  15    METs  5.37      Elliptical   Level  2    Speed  5.8    Minutes  15      T5 Nustep   Level  5    SPM  100    Minutes  15    METs  4      Prescription Details   Frequency (times per week)  3    Duration  Progress to 45 minutes of aerobic exercise without signs/symptoms of physical distress      Intensity   THRR 40-80% of Max Heartrate  102-151    Ratings of Perceived Exertion  11-13    Perceived Dyspnea  0-4      Progression   Progression  Continue to progress workloads to maintain intensity without signs/symptoms of physical distress.      Resistance Training   Training Prescription  Yes    Weight  5 lbs    Reps  10-15       Discharge Exercise Prescription (Final Exercise Prescription Changes): Exercise Prescription Changes - 11/13/17 0900      Response to Exercise   Blood Pressure (Admit)  122/80    Blood Pressure (Exercise)  128/68    Blood Pressure (Exit)  104/50    Heart Rate (Admit)  59 bpm    Heart Rate (Exercise)  114 bpm    Heart Rate (Exit)  58 bpm    Rating of  Perceived Exertion (Exercise)  12    Symptoms  none    Duration  Continue with 45 min of aerobic exercise without signs/symptoms of physical distress.    Intensity  THRR unchanged      Progression   Progression  Continue to progress workloads to maintain intensity without signs/symptoms of physical distress.    Average METs  6      Resistance Training   Training Prescription  Yes    Weight  5 lb    Reps  10-15      Interval Training   Interval Training  No      Treadmill   MPH  4    Grade  7    Minutes  15    METs  7.92      T5 Nustep   Level  5    Minutes  15    METs  4.1      Home Exercise Plan  Plans to continue exercise at  [facility] (comment) walking and [employer]    Frequency  Add 3 additional days to program exercise sessions.    Initial Home Exercises Provided  10/05/17       Functional Capacity: 6 Minute Walk    Row Name 09/28/17 1232         6 Minute Walk   Phase  Initial     Distance  1720 feet     Walk Time  6 minutes     # of Rest Breaks  0     MPH  3.26     METS  5.44     RPE  8     VO2 Peak  19.04     Symptoms  No     Resting HR  53 bpm     Resting BP  122/70     Resting Oxygen Saturation   97 %     Exercise Oxygen Saturation  during 6 min walk  99 %     Max Ex. HR  109 bpm     Max Ex. BP  146/64     2 Minute Post BP  124/70        Psychological, QOL, Others - Outcomes: PHQ 2/9: Depression screen [facility] 2/9 09/28/2017 11/11/2016 05/28/2016  Decreased Interest 0 1 0  Down, Depressed, Hopeless 0 1 0  PHQ - 2 Score 0 2 0  Altered sleeping 1 0 -  Tired, decreased energy 0 2 -  Change in appetite 1 0 -  Feeling bad or failure about yourself  0 0 -  Trouble concentrating 0 0 -  Moving slowly or fidgety/restless 0 0 -  Suicidal thoughts 0 0 -  PHQ-9 Score 2 4 -  Difficult doing work/chores Not difficult at all Somewhat difficult -    Quality of Life: Quality of Life - 09/28/17 1046      Quality of Life Scores    Health/Function Pre  28.9 %    Socioeconomic Pre  30 %    Psych/Spiritual Pre  30 %    Family Pre  30 %    GLOBAL Pre  29.51 %       Personal Goals: Goals established at orientation with interventions provided to work toward goal. Personal Goals and Risk Factors at Admission - 09/28/17 1204      Core Components/Risk Factors/Patient Goals on Admission    Weight Management  Yes;Weight Loss    Intervention  Weight Management: Develop a combined nutrition and exercise program designed to reach desired caloric intake, while maintaining appropriate intake of nutrient and fiber, sodium and fats, and appropriate energy expenditure required for the weight goal.;Weight Management: Provide education and appropriate resources to help participant work on and attain dietary goals.;Weight Management/Obesity: Establish reasonable short term and long term weight goals.    Admit Weight  175 lb (79.4 kg)    Goal Weight: Short Term  170 lb (77.1 kg)    Goal Weight: Long Term  170 lb (77.1 kg)    Expected Outcomes  Short Term: Continue to assess and modify interventions until short term weight is achieved;Long Term: Adherence to nutrition and physical activity/exercise program aimed toward attainment of established weight goal;Weight Loss: Understanding of general recommendations for a balanced deficit meal plan, which promotes 1-2 lb weight loss per week and includes a negative energy balance of [id number] kcal/d;Understanding recommendations for meals to include 15-35% energy as protein, 25-35% energy from fat, 35-60% energy  from carbohydrates, less than 200mg  of dietary cholesterol, 20-35 gm of total fiber daily;Understanding of distribution of calorie intake throughout the day with the consumption of 4-5 meals/snacks    Tobacco Cessation  Yes Quit  information given    Number of packs per day  3/4 a pack    Intervention  Assist the participant in steps to quit. Provide individualized education and counseling  about committing to Tobacco Cessation, relapse prevention, and pharmacological support that can be provided by physician.;Education officer, environmental, assist with locating and accessing local/national Quit Smoking programs, and support quit date choice.    Expected Outcomes  Short Term: Will demonstrate readiness to quit, by selecting a quit date.;Long Term: Complete abstinence from all tobacco products for at least 12 months from quit date.;Short Term: Will quit all tobacco product use, adhering to prevention of relapse plan.    Hypertension  Yes    Intervention  Provide education on lifestyle modifcations including regular physical activity/exercise, weight management, moderate sodium restriction and increased consumption of fresh fruit, vegetables, and low fat dairy, alcohol moderation, and smoking cessation.;Monitor prescription use compliance.    Expected Outcomes  Short Term: Continued assessment and intervention until BP is < 140/16mm HG in hypertensive participants. < 130/4mm HG in hypertensive participants with diabetes, heart failure or chronic kidney disease.;Long Term: Maintenance of blood pressure at goal levels.    Lipids  Yes    Intervention  Provide education and support for participant on nutrition & aerobic/resistive exercise along with prescribed medications to achieve LDL 70mg , HDL >40mg .    Expected Outcomes  Short Term: Participant states understanding of desired cholesterol values and is compliant with medications prescribed. Participant is following exercise prescription and nutrition guidelines.;Long Term: Cholesterol controlled with medications as prescribed, with individualized exercise RX and with personalized nutrition plan. Value goals: LDL < 70mg , HDL > 40 mg.        Personal Goals Discharge: Goals and Risk Factor Review    Row Name 10/05/17 4193 10/16/17 0806           Core Components/Risk Factors/Patient Goals Review   Personal Goals Review  Tobacco Cessation   Weight Management/Obesity;Tobacco Cessation;Hypertension;Lipids      Review  Mellody Dance is interested in quitting smoking for good. He is down to 5 cigarettes in last 24 hours.  We talked some about it today and he is ready to quit.  Gave him quit smoking packet and fake cigarette to use/hold.  Also talked about combination therapy with gum that he is already using and patches. He would like to avoid using Chantix if possible.  We will talk about setting a quit date later this week as he tries the fake cigarette and look into using patches with gum.   Keith's weight has been holding steady around 184.  He just wants to continue to work towards building more muscle.  He has been doing pretty well with blood pressures, today it was little higher after a cup of coffee.  Mellody Dance check his blood pressures at least twice a week at home.  He is doing well on his medications.   He is still at 5-6 cigarettes a day.  He is not sure what the hang up is for him.  He is thinking about the Chantix more, but has yet to try the patches.  He has to make the decison to quit.  He can make it a day without a cigarette but eventually gets one.  Expected Outcomes  Short: Set quit date.  Long: Continue to remain tobacco free  Short: Decide about Chantix.  Long: Continue to work on risk factors.          Exercise Goals and Review: Exercise Goals    Row Name 09/28/17 1235             Exercise Goals   Increase Physical Activity  Yes       Intervention  Provide advice, education, support and counseling about physical activity/exercise needs.;Develop an individualized exercise prescription for aerobic and resistive training based on initial evaluation findings, risk stratification, comorbidities and participant's personal goals.       Expected Outcomes  Short Term: Attend rehab on a regular basis to increase amount of physical activity.;Long Term: Add in home exercise to make exercise part of routine and to increase amount of  physical activity.;Long Term: Exercising regularly at least 3-5 days a week.       Increase Strength and Stamina  Yes       Intervention  Provide advice, education, support and counseling about physical activity/exercise needs.;Develop an individualized exercise prescription for aerobic and resistive training based on initial evaluation findings, risk stratification, comorbidities and participant's personal goals.       Expected Outcomes  Short Term: Increase workloads from initial exercise prescription for resistance, speed, and METs.;Short Term: Perform resistance training exercises routinely during rehab and add in resistance training at home;Long Term: Improve cardiorespiratory fitness, muscular endurance and strength as measured by increased METs and functional capacity ( )       Able to understand and use rate of perceived exertion (RPE) scale  Yes       Intervention  Provide education and explanation on how to use RPE scale       Expected Outcomes  Short Term: Able to use RPE daily in rehab to express subjective intensity level;Long Term:  Able to use RPE to guide intensity level when exercising independently       Knowledge and understanding of Target Heart Rate Range (THRR)  Yes       Intervention  Provide education and explanation of THRR including how the numbers were predicted and where they are located for reference       Expected Outcomes  Short Term: Able to state/look up THRR;Short Term: Able to use daily as guideline for intensity in rehab;Long Term: Able to use THRR to govern intensity when exercising independently       Able to check pulse independently  Yes       Intervention  Provide education and demonstration on how to check pulse in carotid and radial arteries.;Review the importance of being able to check your own pulse for safety during independent exercise       Expected Outcomes  Short Term: Able to explain why pulse checking is important during independent exercise;Long  Term: Able to check pulse independently and accurately       Understanding of Exercise Prescription  Yes       Intervention  Provide education, explanation, and written materials on patient's individual exercise prescription       Expected Outcomes  Short Term: Able to explain program exercise prescription;Long Term: Able to explain home exercise prescription to exercise independently          Nutrition & Weight - Outcomes: Pre Biometrics - 09/28/17 1236      Pre Biometrics   Height  5' 9.7" (1.77 m)    Weight  174 lb  12.8 oz (79.3 kg)    Waist Circumference  34 inches    Hip Circumference  36 inches    Waist to Hip Ratio  0.94 %    BMI (Calculated)  25.31    Single Leg Stand  30 seconds        Nutrition: Nutrition Therapy & Goals - 11/12/17 1447      Nutrition Therapy   RD appointment deferred  -- Patient was scheduled to meet with RD 10/26/17 but did not show       Nutrition Discharge: Nutrition Assessments - 09/28/17 1046      MEDFICTS Scores   Pre Score  11       Education Questionnaire Score: Knowledge Questionnaire Score - 09/28/17 1046      Knowledge Questionnaire Score   Pre Score  24/26 Reviewed results with pt today.  Education focuses: Nutrition and Exercise       Goals reviewed with patient; copy given to patient.

## 2017-12-08 ENCOUNTER — Telehealth: Payer: Self-pay | Admitting: *Deleted

## 2017-12-08 NOTE — Telephone Encounter (Signed)
Received request for refill for Chantix from [facility] in [place]. They are asking "How many times to refill before handing over to PCP?" Left message with patient to see if he is still using this medication.  Called [facility] to inquire if they received the prescription for Chantix for maintenance dosing. They had received it and said the request they sent was not needed.   Left another message with patient to disregard first message and to call back if needed.

## 2017-12-09 ENCOUNTER — Other Ambulatory Visit: Payer: Self-pay | Admitting: *Deleted

## 2017-12-09 NOTE — Telephone Encounter (Signed)
Please advise if ok to refill. 

## 2017-12-09 NOTE — Telephone Encounter (Signed)
S/w pharmacy yesterday (see telephone note). They have the maintenance dose on file and this refill is not needed.

## 2018-01-12 ENCOUNTER — Encounter: Payer: Self-pay | Admitting: Nurse Practitioner

## 2018-01-12 ENCOUNTER — Ambulatory Visit (INDEPENDENT_AMBULATORY_CARE_PROVIDER_SITE_OTHER): Payer: 59 | Admitting: Nurse Practitioner

## 2018-01-12 VITALS — BP 114/70 | HR 61 | Ht 71.0 in | Wt 170.0 lb

## 2018-01-12 DIAGNOSIS — I2510 Atherosclerotic heart disease of native coronary artery without angina pectoris: Secondary | ICD-10-CM | POA: Diagnosis not present

## 2018-01-12 DIAGNOSIS — I10 Essential (primary) hypertension: Secondary | ICD-10-CM

## 2018-01-12 DIAGNOSIS — E785 Hyperlipidemia, unspecified: Secondary | ICD-10-CM

## 2018-01-12 DIAGNOSIS — Z720 Tobacco use: Secondary | ICD-10-CM

## 2018-01-12 NOTE — Patient Instructions (Signed)
Medication Instructions:  Your physician recommends that you continue on your current medications as directed. Please refer to the Current Medication list given to you today.   Labwork: Your physician recommends that you return for lab work in: TODAY (LIVER).   Testing/Procedures: none  Follow-Up: Your physician recommends that you schedule a follow-up appointment in: 6 MONTHS WITH DR END.   If you need a refill on your cardiac medications before your next appointment, please call your pharmacy.

## 2018-01-12 NOTE — Progress Notes (Signed)
Office Visit    Patient Name: Nathan Gray Date of Encounter: 01/12/2018  Primary Care Provider:  Reubin MilanBerglund, Laura H, MD Primary Cardiologist:  Yvonne Kendallhristopher End, MD  Chief Complaint    47 y/o ? s/p NSTEMI in 08/2017 (DES to RCA), HTN, HL, tob abuse, and anxiety, who presents for f/u.  Past Medical History    Past Medical History:  Diagnosis Date  . Anxiety   . CAD (coronary artery disease)    a. 08/2017 NSTEMI/PCI: LM nl, LAD 30ost, 1043m, D1 50ost, RI nl, LCX small/nl, RCA 70p/81m (3.0x28 [place] DES), EF 65%.  . History of echocardiogram    a. 08/2017 Echo: EF 65-70%, no rwma, mildly dil LA.  Marland Kitchen. Hyperlipidemia   . Hypertension   . Tobacco abuse    Past Surgical History:  Procedure Laterality Date  . APPENDECTOMY    . CORONARY STENT INTERVENTION N/A 08/19/2017   Procedure: CORONARY STENT INTERVENTION;  Surgeon: Yvonne KendallEnd, Irby Fails, MD;  Location: [facility] INVASIVE CV LAB;  Service: Cardiovascular;  Laterality: N/A;  . LEFT HEART CATH AND CORONARY ANGIOGRAPHY N/A 08/19/2017   Procedure: LEFT HEART CATH AND CORONARY ANGIOGRAPHY;  Surgeon: Yvonne KendallEnd, Adeleigh Barletta, MD;  Location: [facility] INVASIVE CV LAB;  Service: Cardiovascular;  Laterality: N/A;    Allergies  Allergies  Allergen Reactions  . Amoxicillin   . Penicillins     Has patient had a PCN reaction causing immediate rash, facial/tongue/throat swelling, SOB or lightheadedness with hypotension: Unknown Has patient had a PCN reaction causing severe rash involving mucus membranes or skin necrosis: Unknown Has patient had a PCN reaction that required hospitalization: Unknown Has patient had a PCN reaction occurring within the last 10 years: Unknown If all of the above answers are "NO", then may proceed with Cephalosporin use.    History of Present Illness    47 year old male with history of anxiety, tobacco abuse, and untreated hypertension.  He has a family history of CAD with his father suffering MI in his mid 5950s and subsequently  required CABG.  In April 2019, he [facility] to  [facility] with intermittent rest and exertional chest discomfort associated with dyspnea.  He was hypertensive on arrival.  He ruled in for non-STEMI.  Diagnostic catheterization showed severe proximal and mid right coronary artery disease with otherwise nonobstructive disease and normal LV function.  The RCA was successfully stented using a drug-eluting stent.  Follow-up echo showed an EF of 65 to 70%.  He had significant LVH suggestive of a long history of untreated hypertension.  He did well post procedure and was symptom-free at follow-up in April and again in June.  Since his last visit, he has continued to do well.  He is very active, finishing up his new house and moving things as well as taking walks on a regular basis.  He has joined a gym and will be working out with some friends from work.  He did have an episode of chest discomfort that was very brief and mild in nature the other day.  There were no associated symptoms and symptoms resolved spontaneously after a few minutes.  He thinks it may have been muscle soreness or spasm.  He has not had any recurrence despite remaining active.  He has had great success with Chantix and now very rarely, will take a hit from a cigarette but never smoke more than about a quarter of a cigarette.  He says this is only a few times a month.  He plans to quit completely  prior to discontinuation of Chantix.  He denies dyspnea, palpitations, PND, orthopnea, dizziness, syncope, edema, or early satiety.  Home Medications    Prior to Admission medications   Medication Sig Start Date End Date Taking? Authorizing Provider  aspirin 81 MG chewable tablet Chew 1 tablet (81 mg total) by mouth daily. 08/22/17   Enedina Finner, MD  atorvastatin (LIPITOR) 80 MG tablet Take 1 tablet (80 mg total) by mouth daily at 6 PM. 08/27/17   Creig Hines, NP  carvedilol (COREG) 6.25 MG tablet Take 1 tablet (6.25 mg total) by  mouth 2 (two) times daily with a meal. 08/27/17   Creig Hines, NP  citalopram (CELEXA) 20 MG tablet TAKE 1 TABLET (20 MG TOTAL) BY MOUTH DAILY. 09/28/17   Reubin Milan, MD  losartan (COZAAR) 50 MG tablet Take 1 tablet (50 mg total) by mouth daily. 08/27/17   Creig Hines, NP  nitroGLYCERIN (NITROSTAT) 0.4 MG SL tablet Place 1 tablet (0.4 mg total) under the tongue every 5 (five) minutes as needed for chest pain. 08/21/17   Enedina Finner, MD  ticagrelor (BRILINTA) 90 MG TABS tablet Take 1 tablet (90 mg total) by mouth 2 (two) times daily. 08/27/17   Creig Hines, NP  varenicline (CHANTIX CONTINUING MONTH PAK) 1 MG tablet Take 1 tablet (1 mg total) by mouth 2 (two) times daily. 11/04/17   End, Cristal Deer, MD  varenicline (CHANTIX STARTING MONTH PAK) 0.5 MG X 11 & 1 MG X 42 tablet Take one 0.5 mg tablet by mouth once daily for 3 days, then increase to one 0.5 mg tablet twice daily for 4 days, then increase to one 1 mg tablet twice daily. 11/04/17   End, Cristal Deer, MD    Review of Systems    One brief episode of chest discomfort that has not recurred despite remaining active.  He denies dyspnea, palpitations, PND, orthopnea, dizziness, syncope, edema, or early satiety.  All other systems reviewed and are otherwise negative except as noted above.  Physical Exam    VS:  BP 114/70 (BP Location: Left Arm, Patient Position: Sitting, Cuff Size: Normal)   Pulse 61   Ht 5\' 11"  (1.803 m)   Wt 170 lb (77.1 kg)   BMI 23.71 kg/m  , BMI Body mass index is 23.71 kg/m. GEN: Well nourished, well developed, in no acute distress.  HEENT: normal.  Neck: Supple, no JVD, carotid bruits, or masses. Cardiac: RRR, no murmurs, rubs, or gallops. No clubbing, cyanosis, edema.  Radials/DP/PT 2+ and equal bilaterally.  Respiratory:  Respirations regular and unlabored, clear to auscultation bilaterally. GI: Soft, nontender, nondistended, BS + x 4. MS: no deformity or atrophy. Skin:  warm and dry, no rash. Neuro:  Strength and sensation are intact. Psych: Normal affect.  Accessory Clinical Findings    ECG -sinus arrhythmia, 61, no acute ST or T changes.  Assessment & Plan    1.  Coronary artery disease: Status post non-STEMI in April with drug-eluting stent placement to the RCA.  He has done well since then.  He did have one brief episode of mild left-sided chest discomfort a few weeks ago but has been very active and has had no recurrence.  He has been walking regularly without symptoms or limitations.  Reassurance provided however, if he has any recurrence of symptoms, we would have a low threshold to consider additional ischemic testing.  He remains on aspirin, Brilinta, statin, beta-blocker, and ARB therapy.  He has completed cardiac rehab.  2.  Essential hypertension: Stable on beta-blocker and ARB.  3.  Hyperlipidemia: Recent LDL 56.  LFTs were ordered but not drawn.  We will check today.  Continue high potency statin therapy.  He has been tolerating well.  4.  Tobacco abuse: Placed on Chantix in June.  Doing much better.  He says he will occasionally have a hit off of his cigarette but this is only a few times a month.  His plan is to have quit completely prior to discontinuation of Chantix.  Complete cessation advised.  5.  Anxiety: Stable on home dose of Celexa.  6.  Disposition: Follow-up LFTs today.  Follow-up in 6 months or sooner if necessary.  Nicolasa Ducking, NP 01/12/2018, 8:14 AM

## 2018-01-13 LAB — HEPATIC FUNCTION PANEL
ALT: 45 IU/L — AB (ref 0–44)
AST: 31 IU/L (ref 0–40)
Albumin: 4.8 g/dL (ref 3.5–5.5)
Alkaline Phosphatase: 74 IU/L (ref 39–117)
Bilirubin Total: 0.5 mg/dL (ref 0.0–1.2)
Bilirubin, Direct: 0.17 mg/dL (ref 0.00–0.40)
Total Protein: 7.4 g/dL (ref 6.0–8.5)

## 2018-01-14 ENCOUNTER — Telehealth: Payer: Self-pay | Admitting: Internal Medicine

## 2018-01-14 ENCOUNTER — Other Ambulatory Visit: Payer: Self-pay | Admitting: *Deleted

## 2018-01-14 DIAGNOSIS — R7401 Elevation of levels of liver transaminase levels: Secondary | ICD-10-CM

## 2018-01-14 DIAGNOSIS — R740 Nonspecific elevation of levels of transaminase and lactic acid dehydrogenase [LDH]: Principal | ICD-10-CM

## 2018-01-14 NOTE — Telephone Encounter (Signed)
Patient calling to discuss recent lab testing results  ° °Please call  ° °

## 2018-01-14 NOTE — Telephone Encounter (Signed)
Patient aware of results. See result note.  

## 2018-03-02 ENCOUNTER — Ambulatory Visit: Payer: 59 | Admitting: Internal Medicine

## 2018-03-11 ENCOUNTER — Ambulatory Visit: Payer: 59 | Admitting: Internal Medicine

## 2018-03-11 NOTE — Progress Notes (Deleted)
    Date:  03/11/2018   Name:  Nathan Gray   DOB:  18-Feb-1971   MRN:  [id number]   Chief Complaint: No chief complaint on file.  Hypertension  Associated symptoms include anxiety.  Nicotine Dependence  His urge triggers include company of smokers.  Anxiety  Presents for follow-up visit. The quality of sleep is good.   Compliance with medications is 76-100%.  CAD - s/p stent, on aspirin,lipitor,brilinta.   Review of Systems  Patient Active Problem List   Diagnosis Date Noted  . Coronary artery disease involving native coronary artery of native heart without angina pectoris 11/04/2017  . Hyperlipidemia LDL goal <70 11/04/2017  . Essential hypertension 08/31/2017  . NSTEMI (non-ST elevated myocardial infarction) (HCC) 08/19/2017  . Anxiety disorder 05/28/2016  . Other insomnia 05/28/2016  . Tobacco abuse 05/28/2016    Allergies  Allergen Reactions  . Amoxicillin   . Penicillins     Has patient had a PCN reaction causing immediate rash, facial/tongue/throat swelling, SOB or lightheadedness with hypotension: Unknown Has patient had a PCN reaction causing severe rash involving mucus membranes or skin necrosis: Unknown Has patient had a PCN reaction that required hospitalization: Unknown Has patient had a PCN reaction occurring within the last 10 years: Unknown If all of the above answers are "NO", then may proceed with Cephalosporin use.    Past Surgical History:  Procedure Laterality Date  . APPENDECTOMY    . CORONARY STENT INTERVENTION N/A 08/19/2017   Procedure: CORONARY STENT INTERVENTION;  Surgeon: Yvonne Kendall, MD;  Location: [facility] INVASIVE CV LAB;  Service: Cardiovascular;  Laterality: N/A;  . LEFT HEART CATH AND CORONARY ANGIOGRAPHY N/A 08/19/2017   Procedure: LEFT HEART CATH AND CORONARY ANGIOGRAPHY;  Surgeon: Yvonne Kendall, MD;  Location: [facility] INVASIVE CV LAB;  Service: Cardiovascular;  Laterality: N/A;    Social History   Tobacco Use  . Smoking  status: Current Every Day Smoker    Packs/day: 0.25    Years: 20.00    Pack years: 5.00    Types: Cigarettes  . Smokeless tobacco: Former Neurosurgeon  . Tobacco comment: 09/30/17 down to cigarettes in last 24 hrs  Substance Use Topics  . Alcohol use: Yes    Comment: few drinks/week  . Drug use: Yes    Types: Marijuana    Comment: smoked mj for first time in 20 yrs about 1 wk ago.     Medication list has been reviewed and updated.  No outpatient medications have been marked as taking for the 03/11/18 encounter (Appointment) with Reubin Milan, MD.    [facility] 2/9 Scores 09/28/2017 11/11/2016 05/28/2016  PHQ - 2 Score 0 2 0  PHQ- 9 Score 2 4 -    Physical Exam  There were no vitals taken for this visit.  Assessment and Plan:

## 2018-03-28 ENCOUNTER — Other Ambulatory Visit: Payer: Self-pay | Admitting: Internal Medicine

## 2018-03-29 NOTE — Telephone Encounter (Signed)
Please review Chantix refill.   The patient was given a Rx in June & is requesting another refill.

## 2018-04-01 NOTE — Telephone Encounter (Signed)
No answer. Left message to call back. Need to ask patient if he is still taking medication. He is also due for appointment in February and needs to be scheduled.   Routing to Dr End to advise if ok to send in refill if patient needs it.

## 2018-04-02 NOTE — Telephone Encounter (Signed)
Yes, refill can be sent in.  Nathan Kendallhristopher Breahna Boylen, MD [facility] Pager: [phone]

## 2018-04-02 NOTE — Telephone Encounter (Signed)
Refill sent in to pharmacy 

## 2018-08-19 ENCOUNTER — Telehealth: Payer: Self-pay | Admitting: Internal Medicine

## 2018-08-19 NOTE — Telephone Encounter (Signed)
Virtual Visit Pre-Appointment Phone Call  Steps For Call:  1. Confirm consent - "In the setting of the current Covid19 crisis, you are scheduled for a (phone or video) visit with your provider on (date) at (time).  Just as we do with many in-office visits, in order for you to participate in this visit, we must obtain consent.  If you'd like, I can send this to your mychart (if signed up) or email for you to review.  Otherwise, I can obtain your verbal consent now.  All virtual visits are billed to your insurance company just like a normal visit would be.  By agreeing to a virtual visit, we'd like you to understand that the technology does not allow for your provider to perform an examination, and thus may limit your provider's ability to fully assess your condition.  Finally, though the technology is pretty good, we cannot assure that it will always work on either your or our end, and in the setting of a video visit, we may have to convert it to a phone-only visit.  In either situation, we cannot ensure that we have a secure connection.  Are you willing to proceed?"  2. Give patient instructions for WebEx download to smartphone as below if video visit  3. Advise patient to be prepared with any vital sign or heart rhythm information, their current medicines, and a piece of paper and pen handy for any instructions they may receive the day of their visit  4. Inform patient they will receive a phone call 15 minutes prior to their appointment time (may be from unknown caller ID) so they should be prepared to answer  5. Confirm that appointment type is correct in Epic appointment notes (video vs telephone)    TELEPHONE CALL NOTE  CARLOSALBERTO Nathan Gray has been deemed a candidate for a follow-up tele-health visit to limit community exposure during the Covid-19 pandemic. I spoke with the patient via phone to ensure availability of phone/video source, confirm preferred email & phone number, and discuss  instructions and expectations.  I reminded RIPLEY STADNIK to be prepared with any vital sign and/or heart rhythm information that could potentially be obtained via home monitoring, at the time of his visit. I reminded MANNAN DEBAKER to expect a phone call at the time of his visit if his visit.  Did the patient verbally acknowledge consent to treatment? yes  Norman Herrlich 08/19/2018 2:48 PM   DOWNLOADING THE WEBEX SOFTWARE TO SMARTPHONE  - If Apple, go to [employer] and type in WebEx in the search bar. Download [employer], the blue/green circle. The app is free but as with any other app downloads, their phone may require them to verify saved payment information or Apple password. The patient does NOT have to create an account.  - If Android, ask patient to go to [employer] and type in WebEx in the search bar. Download [employer], the blue/green circle. The app is free but as with any other app downloads, their phone may require them to verify saved payment information or Android password. The patient does NOT have to create an account.   CONSENT FOR TELE-HEALTH VISIT - PLEASE REVIEW  I hereby voluntarily request, consent and authorize [facility] and its employed or contracted physicians, physician assistants, nurse practitioners or other licensed health care professionals (the Practitioner), to provide me with telemedicine health care services (the Services") as deemed necessary by the treating Practitioner. I acknowledge  and consent to receive the Services by the Practitioner via telemedicine. I understand that the telemedicine visit will involve communicating with the Practitioner through live audiovisual communication technology and the disclosure of certain medical information by electronic transmission. I acknowledge that I have been given the opportunity to request an in-person assessment or other available alternative prior to the telemedicine visit and  am voluntarily participating in the telemedicine visit.  I understand that I have the right to withhold or withdraw my consent to the use of telemedicine in the course of my care at any time, without affecting my right to future care or treatment, and that the Practitioner or I may terminate the telemedicine visit at any time. I understand that I have the right to inspect all information obtained and/or recorded in the course of the telemedicine visit and may receive copies of available information for a reasonable fee.  I understand that some of the potential risks of receiving the Services via telemedicine include:   Delay or interruption in medical evaluation due to technological equipment failure or disruption;  Information transmitted may not be sufficient (e.g. poor resolution of images) to allow for appropriate medical decision making by the Practitioner; and/or   In rare instances, security protocols could fail, causing a breach of personal health information.  Furthermore, I acknowledge that it is my responsibility to provide information about my medical history, conditions and care that is complete and accurate to the best of my ability. I acknowledge that Practitioner's advice, recommendations, and/or decision may be based on factors not within their control, such as incomplete or inaccurate data provided by me or distortions of diagnostic images or specimens that may result from electronic transmissions. I understand that the practice of medicine is not an exact science and that Practitioner makes no warranties or guarantees regarding treatment outcomes. I acknowledge that I will receive a copy of this consent concurrently upon execution via email to the email address I last provided but may also request a printed copy by calling the office of [facility].    I understand that my insurance will be billed for this visit.   I have read or had this consent read to me.  I understand the  contents of this consent, which adequately explains the benefits and risks of the Services being provided via telemedicine.   I have been provided ample opportunity to ask questions regarding this consent and the Services and have had my questions answered to my satisfaction.  I give my informed consent for the services to be provided through the use of telemedicine in my medical care  By participating in this telemedicine visit I agree to the above.

## 2018-08-20 NOTE — Progress Notes (Signed)
Virtual Visit via Telephone Note    Evaluation Performed:  Follow-up visit  This visit type was conducted due to national recommendations for restrictions regarding the COVID-19 Pandemic (e.g. social distancing).  This format is felt to be most appropriate for this patient at this time.  All issues noted in this document were discussed and addressed.  No physical exam was performed (except for noted visual exam findings with Video Visits).  Verbal consent was obtained from the patient.  Date:  08/23/2018   ID:  Nathan Gray, DOB Aug 13, 1970, MRN [id number]  Patient Location:  [address] [place] [address]  Provider location:   [place], [place]  PCP:  Nathan Milan, MD  Cardiologist:  Nathan Kendall, MD  Electrophysiologist:  None   Chief Complaint: Follow-up coronary artery disease  History of Present Illness:    Nathan Gray is a 48 y.o. male who presents via audio/video conferencing for a telehealth visit today.  He has a history of  CAD with NSTEMI status post PCI to RCA (4/19), hypertension, tobacco use, and anxiety.  He was last seen by Nathan Givens, NP, in 12/2017.  He was doing well at that time and happily reported that he had almost completely stop smoking with the assistance of Chantix.  No medication changes were made at that time.  Minimal ALT elevation was incidentally noted, with plans to repeat LFTs in approximately 8 weeks.  Does not appear that these labs were ever drawn.  Today, [name] reports that he has been feeling very well.  He is exercising regularly and running up to 5 miles a day.  He denies chest pain, shortness of breath, palpitations, lightheadedness, and edema.  He is tolerating his medications well, including dual antiplatelet therapy with aspirin and ticagrelor.  He has been trying to follow social distancing protocols and is predominately working from home during the COVID-19 pandemic.  The patient does not have symptoms  concerning for COVID-19 infection (fever, chills, cough, or new shortness of breath).    Prior CV studies:   The following studies were reviewed today:  TTE (08/20/2017): Normal LV size with moderate LVH.  LVEF 65-70% with normal wall motion and diastolic function.  Mild left atrial enlargement.  Normal RV size and function.  LHC/PCI (08/19/2017): Severe single-vessel coronary artery disease with sequential 70% proximal and 95% mid RCA stenoses.  Mild to moderate, nonobstructive CAD involving the LAD and diagonal branch.  LVEF greater than 65% with mildly elevated LVEDP.  Successful PCI to mid RCA using Xience Sierra 3.0 x 28 mm drug-eluting stent.  Past Medical History:  Diagnosis Date  . Anxiety   . CAD (coronary artery disease)    a. 08/2017 NSTEMI/PCI: LM nl, LAD 30ost, 45m, D1 50ost, RI nl, LCX small/nl, RCA 70p/78m (3.0x28 [place] DES), EF 65%.  . History of echocardiogram    a. 08/2017 Echo: EF 65-70%, no rwma, mildly dil LA.  Marland Kitchen Hyperlipidemia   . Hypertension   . Tobacco abuse    Past Surgical History:  Procedure Laterality Date  . APPENDECTOMY    . CORONARY STENT INTERVENTION N/A 08/19/2017   Procedure: CORONARY STENT INTERVENTION;  Surgeon: Nathan Kendall, MD;  Location: [facility] INVASIVE CV LAB;  Service: Cardiovascular;  Laterality: N/A;  . LEFT HEART CATH AND CORONARY ANGIOGRAPHY N/A 08/19/2017   Procedure: LEFT HEART CATH AND CORONARY ANGIOGRAPHY;  Surgeon: Nathan Kendall, MD;  Location: [facility] INVASIVE CV LAB;  Service: Cardiovascular;  Laterality: N/A;  Current Meds  Medication Sig  . aspirin 81 MG chewable tablet Chew 1 tablet (81 mg total) by mouth daily.  Marland Kitchen atorvastatin (LIPITOR) 80 MG tablet Take 1 tablet (80 mg total) by mouth daily at 6 PM.  . carvedilol (COREG) 6.25 MG tablet Take 1 tablet (6.25 mg total) by mouth 2 (two) times daily with a meal.  . citalopram (CELEXA) 20 MG tablet TAKE 1 TABLET (20 MG TOTAL) BY MOUTH DAILY.  Marland Kitchen losartan (COZAAR) 50 MG tablet Take 1  tablet (50 mg total) by mouth daily.  . nitroGLYCERIN (NITROSTAT) 0.4 MG SL tablet Place 1 tablet (0.4 mg total) under the tongue every 5 (five) minutes as needed for chest pain.  . ticagrelor (BRILINTA) 90 MG TABS tablet Take 1 tablet (90 mg total) by mouth 2 (two) times daily.     Allergies:   Amoxicillin and Penicillins   Social History   Tobacco Use  . Smoking status: Current Every Day Smoker    Packs/day: 0.25    Years: 20.00    Pack years: 5.00    Types: Cigarettes  . Smokeless tobacco: Former Neurosurgeon  . Tobacco comment: 09/30/17 down to cigarettes in last 24 hrs  Substance Use Topics  . Alcohol use: Yes    Comment: few drinks/week  . Drug use: Yes    Types: Marijuana    Comment: smoked mj for first time in 20 yrs about 1 wk ago.     Family Hx: The patient's family history includes CAD in his father; Cancer in his paternal grandfather; Diabetes in his brother and father; Heart attack in his father; Hypertension in his mother.  ROS:   Please see the history of present illness.   All other systems reviewed and are negative.   Labs/Other Tests and Data Reviewed:    Recent Labs: 01/12/2018: ALT 45   Recent Lipid Panel Lab Results  Component Value Date/Time   CHOL 129 11/25/2017 08:27 AM   TRIG 143 11/25/2017 08:27 AM   HDL 44 11/25/2017 08:27 AM   CHOLHDL 2.9 11/25/2017 08:27 AM   CHOLHDL 4.1 08/21/2017 12:58 AM   LDLCALC 56 11/25/2017 08:27 AM    Wt Readings from Last 3 Encounters:  08/23/18 179 lb (81.2 kg)  01/12/18 170 lb (77.1 kg)  11/04/17 172 lb 8 oz (78.2 kg)     Objective:    Vital Signs:  Ht 5\' 11"  (1.803 m)   Wt 179 lb (81.2 kg)   BMI 24.97 kg/m     ASSESSMENT & PLAN:    Coronary artery disease without angina: Mr. Nathan Gray has done well following his NSTEMI with PCI to the RCA a year ago.  As he is now 12 months out from dual antiplatelet therapy, we have agreed to discontinue ticagrelor when he has exhausted his current supply.  He will remain on  indefinite low-dose aspirin as well as statin therapy.  Hyperlipidemia: LDL at goal on most recent check.  Minimal transaminitis was noted in 12/2017.  We will plan to repeat a lipid panel and CMP at the patient's convenience in about 3 months.  For now, continue atorvastatin 80 mg daily.  Hypertension: Mr. Buelna reports blood pressure controlled at home.  Continue current doses of carvedilol and losartan.  COVID-19 Education: The signs and symptoms of COVID-19 were discussed with the patient and how to seek care for testing (follow up with PCP or arrange E-visit).  The importance of social distancing was discussed today.  Patient Risk:  After full review of this patient's clinical status, I feel that they are at least moderate risk at this time.  Time:   Today, I have spent 12 minutes with the patient with telehealth technology discussing coronary artery disease, hyperlipidemia, and COVID-19 precautions.     Medication Adjustments/Labs and Tests Ordered: Current medicines are reviewed at length with the patient today.  Concerns regarding medicines are outlined above.  Tests Ordered: Orders Placed This Encounter  Procedures  . Lipid panel  . Comprehensive metabolic panel   Medication Changes: Discontinue ticagrelor when current supply runs out.  Disposition:  Follow up in 6 month(s)  Signed, Nathan Kendall, MD  08/23/2018 8:23 PM    [facility]

## 2018-08-23 ENCOUNTER — Other Ambulatory Visit: Payer: Self-pay

## 2018-08-23 ENCOUNTER — Telehealth: Payer: Self-pay | Admitting: *Deleted

## 2018-08-23 ENCOUNTER — Telehealth (INDEPENDENT_AMBULATORY_CARE_PROVIDER_SITE_OTHER): Payer: 59 | Admitting: Internal Medicine

## 2018-08-23 ENCOUNTER — Encounter: Payer: Self-pay | Admitting: Internal Medicine

## 2018-08-23 VITALS — Ht 71.0 in | Wt 179.0 lb

## 2018-08-23 DIAGNOSIS — Z79899 Other long term (current) drug therapy: Secondary | ICD-10-CM

## 2018-08-23 DIAGNOSIS — E785 Hyperlipidemia, unspecified: Secondary | ICD-10-CM

## 2018-08-23 DIAGNOSIS — I10 Essential (primary) hypertension: Secondary | ICD-10-CM | POA: Diagnosis not present

## 2018-08-23 DIAGNOSIS — I2510 Atherosclerotic heart disease of native coronary artery without angina pectoris: Secondary | ICD-10-CM

## 2018-08-23 NOTE — Telephone Encounter (Signed)
Called patient to go over AVS from Virtual Visit today. He verbalized understanding of the instructions as listed on the AVS. He is aware to call our office sometime in July to schedule appointment for lab work and to be fasting.  He was appreciative.

## 2018-08-23 NOTE — Patient Instructions (Signed)
Medication Instructions:  Your physician has recommended you make the following change in your medication:  1- Once you run out of your current supply, then you may STOP Brilinta.  If you need a refill on your cardiac medications before your next appointment, please call your pharmacy.   Lab work: Your physician recommends that you return for lab work in: July to August at our office. Please call [phone] in July to schedule an appointment to come in.  (LIPID, CMP). You will need to be FASTING.   If you have labs (blood work) drawn today and your tests are completely normal, you will receive your results only by: Marland Kitchen MyChart Message (if you have MyChart) OR . A paper copy in the mail If you have any lab test that is abnormal or we need to change your treatment, we will call you to review the results.  Testing/Procedures: none  Follow-Up: At [facility], you and your health needs are our priority.  As part of our continuing mission to provide you with exceptional heart care, we have created designated Provider Care Teams.  These Care Teams include your primary Cardiologist (physician) and Advanced Practice Providers (APPs -  Physician Assistants and Nurse Practitioners) who all work together to provide you with the care you need, when you need it. You will need a follow up appointment in 6 months.  Please call our office 2 months in advance to schedule this appointment.  You may see Yvonne Kendall, MD or one of the following Advanced Practice Providers on your designated Care Team:   Nicolasa Ducking, NP Eula Listen, PA-C . Marisue Ivan, PA-C

## 2018-10-18 ENCOUNTER — Other Ambulatory Visit: Payer: Self-pay | Admitting: Internal Medicine

## 2018-10-18 DIAGNOSIS — F419 Anxiety disorder, unspecified: Secondary | ICD-10-CM

## 2018-11-08 ENCOUNTER — Other Ambulatory Visit: Payer: Self-pay | Admitting: *Deleted

## 2018-11-08 MED ORDER — ATORVASTATIN CALCIUM 80 MG PO TABS: 80.0000 mg | ORAL_TABLET | Freq: Every day | ORAL | 3 refills | Status: DC

## 2018-11-22 ENCOUNTER — Other Ambulatory Visit: Payer: Self-pay | Admitting: *Deleted

## 2018-11-22 MED ORDER — CARVEDILOL 6.25 MG PO TABS: 6.2500 mg | ORAL_TABLET | Freq: Two times a day (BID) | ORAL | 0 refills | Status: DC

## 2018-11-22 MED ORDER — LOSARTAN POTASSIUM 50 MG PO TABS: 50.0000 mg | ORAL_TABLET | Freq: Every day | ORAL | 0 refills | Status: DC

## 2019-01-22 ENCOUNTER — Other Ambulatory Visit: Payer: Self-pay | Admitting: Internal Medicine

## 2019-01-22 DIAGNOSIS — F419 Anxiety disorder, unspecified: Secondary | ICD-10-CM

## 2019-02-18 ENCOUNTER — Telehealth: Payer: Self-pay

## 2019-02-18 MED ORDER — CARVEDILOL 6.25 MG PO TABS: 6.2500 mg | ORAL_TABLET | Freq: Two times a day (BID) | ORAL | 0 refills | Status: DC

## 2019-02-18 NOTE — Telephone Encounter (Signed)
Requested Prescriptions   Signed Prescriptions Disp Refills  . carvedilol (COREG) 6.25 MG tablet 180 tablet 0    Sig: Take 1 tablet (6.25 mg total) by mouth 2 (two) times daily with a meal. *NEEDS APPOINTMENT FOR FURTHER REFILLS*    Authorizing Provider: END, CHRISTOPHER    Ordering User: NEWCOMER MCCLAIN, BRANDY L

## 2019-02-21 ENCOUNTER — Telehealth: Payer: Self-pay

## 2019-02-21 MED ORDER — LOSARTAN POTASSIUM 50 MG PO TABS: 50.0000 mg | ORAL_TABLET | Freq: Every day | ORAL | 0 refills | Status: DC

## 2019-02-21 NOTE — Telephone Encounter (Signed)
Requested Prescriptions   Signed Prescriptions Disp Refills  . losartan (COZAAR) 50 MG tablet 90 tablet 0    Sig: Take 1 tablet (50 mg total) by mouth daily. *NEEDS OFFICE VISIT FOR FURTHER REFILLS*    Authorizing Provider: END, CHRISTOPHER    Ordering User: NEWCOMER MCCLAIN, BRANDY L

## 2019-03-22 ENCOUNTER — Encounter: Payer: 59 | Admitting: Internal Medicine

## 2019-04-12 ENCOUNTER — Encounter: Payer: Self-pay | Admitting: Internal Medicine

## 2019-04-12 NOTE — Progress Notes (Deleted)
Date:  04/12/2019   Name:  Nathan Gray   DOB:  04-Jan-1971   MRN:  [id number]   Chief Complaint: No chief complaint on file. Nathan Gray is a 48 y.o. male who presents today for his Complete Annual Exam. He feels {DESC; WELL/FAIRLY WELL/POORLY:18703}. He reports exercising ***. He reports he is sleeping {DESC; WELL/FAIRLY WELL/POORLY:18703}.   Hypertension This is a chronic problem. The problem is controlled. Past treatments include beta blockers and angiotensin blockers. The current treatment provides significant improvement. Hypertensive end-organ damage includes CAD/MI.  Hyperlipidemia This is a chronic problem. The problem is controlled. Current antihyperlipidemic treatment includes statins. The current treatment provides significant improvement of lipids.    Lab Results  Component Value Date   CREATININE 0.79 08/21/2017   BUN 24 (H) 08/21/2017   NA 139 08/21/2017   K 3.8 08/21/2017   CL 105 08/21/2017   CO2 28 08/21/2017   Lab Results  Component Value Date   CHOL 129 11/25/2017   HDL 44 11/25/2017   LDLCALC 56 11/25/2017   TRIG 143 11/25/2017   CHOLHDL 2.9 11/25/2017   Lab Results  Component Value Date   TSH 2.326 08/19/2017   No results found for: HGBA1C   Review of Systems  Patient Active Problem List   Diagnosis Date Noted  . Coronary artery disease involving native coronary artery of native heart without angina pectoris 11/04/2017  . Hyperlipidemia LDL goal <70 11/04/2017  . Essential hypertension 08/31/2017  . History of ST elevation myocardial infarction (STEMI) 08/19/2017  . Anxiety disorder 05/28/2016  . Other insomnia 05/28/2016  . Tobacco abuse 05/28/2016    Allergies  Allergen Reactions  . Amoxicillin   . Penicillins     Has patient had a PCN reaction causing immediate rash, facial/tongue/throat swelling, SOB or lightheadedness with hypotension: Unknown Has patient had a PCN reaction causing severe rash involving mucus membranes  or skin necrosis: Unknown Has patient had a PCN reaction that required hospitalization: Unknown Has patient had a PCN reaction occurring within the last 10 years: Unknown If all of the above answers are "NO", then may proceed with Cephalosporin use.    Past Surgical History:  Procedure Laterality Date  . APPENDECTOMY    . CORONARY STENT INTERVENTION N/A 08/19/2017   Procedure: CORONARY STENT INTERVENTION;  Surgeon: Yvonne Kendall, MD;  Location: [facility] INVASIVE CV LAB;  Service: Cardiovascular;  Laterality: N/A;  . LEFT HEART CATH AND CORONARY ANGIOGRAPHY N/A 08/19/2017   Procedure: LEFT HEART CATH AND CORONARY ANGIOGRAPHY;  Surgeon: Yvonne Kendall, MD;  Location: [facility] INVASIVE CV LAB;  Service: Cardiovascular;  Laterality: N/A;    Social History   Tobacco Use  . Smoking status: Current Every Day Smoker    Packs/day: 0.25    Years: 20.00    Pack years: 5.00    Types: Cigarettes  . Smokeless tobacco: Former Neurosurgeon  . Tobacco comment: 09/30/17 down to cigarettes in last 24 hrs  Substance Use Topics  . Alcohol use: Yes    Comment: few drinks/week  . Drug use: Yes    Types: Marijuana    Comment: smoked mj for first time in 20 yrs about 1 wk ago.     Medication list has been reviewed and updated.  No outpatient medications have been marked as taking for the 04/12/19 encounter (Appointment) with Reubin Milan, MD.    [facility] 2/9 Scores 09/28/2017 11/11/2016 05/28/2016  PHQ - 2 Score 0 2 0  PHQ- 9 Score 2 4 -  BP Readings from Last 3 Encounters:  01/12/18 114/70  11/04/17 120/78  08/31/17 114/82    Physical Exam  Wt Readings from Last 3 Encounters:  08/23/18 179 lb (81.2 kg)  01/12/18 170 lb (77.1 kg)  11/04/17 172 lb 8 oz (78.2 kg)    There were no vitals taken for this visit.  Assessment and Plan:

## 2019-05-24 ENCOUNTER — Other Ambulatory Visit: Payer: Self-pay | Admitting: Internal Medicine

## 2019-06-01 ENCOUNTER — Other Ambulatory Visit: Payer: Self-pay | Admitting: Internal Medicine

## 2019-06-01 DIAGNOSIS — F419 Anxiety disorder, unspecified: Secondary | ICD-10-CM

## 2019-06-01 NOTE — Telephone Encounter (Signed)
Left a message

## 2019-06-20 ENCOUNTER — Telehealth: Payer: Self-pay | Admitting: Internal Medicine

## 2019-06-20 NOTE — Telephone Encounter (Signed)
Please schedule overdue F/U appointment. Thank you! ?

## 2019-06-21 ENCOUNTER — Other Ambulatory Visit: Payer: Self-pay | Admitting: Internal Medicine

## 2019-06-21 NOTE — Telephone Encounter (Signed)
Attempted to schedule.  LMOV to call office.  ° °

## 2019-06-24 NOTE — Telephone Encounter (Signed)
lmom to schedule

## 2019-07-01 NOTE — Telephone Encounter (Signed)
3 attempts to schedule fu appt from recall list.   Deleting recall.   

## 2019-07-11 ENCOUNTER — Other Ambulatory Visit: Payer: Self-pay | Admitting: Internal Medicine

## 2019-07-30 ENCOUNTER — Other Ambulatory Visit: Payer: Self-pay | Admitting: Internal Medicine

## 2019-08-16 ENCOUNTER — Other Ambulatory Visit: Payer: Self-pay | Admitting: Internal Medicine

## 2019-09-06 ENCOUNTER — Encounter: Payer: Self-pay | Admitting: Internal Medicine

## 2019-09-06 NOTE — Telephone Encounter (Signed)
Mailed a letter

## 2019-09-16 ENCOUNTER — Other Ambulatory Visit: Payer: Self-pay | Admitting: Internal Medicine

## 2019-09-30 ENCOUNTER — Other Ambulatory Visit: Payer: Self-pay | Admitting: Internal Medicine

## 2019-10-17 ENCOUNTER — Other Ambulatory Visit: Payer: Self-pay | Admitting: Internal Medicine

## 2019-11-16 ENCOUNTER — Other Ambulatory Visit: Payer: Self-pay | Admitting: Internal Medicine

## 2019-11-16 ENCOUNTER — Telehealth: Payer: Self-pay

## 2019-11-16 DIAGNOSIS — F419 Anxiety disorder, unspecified: Secondary | ICD-10-CM

## 2019-11-16 NOTE — Telephone Encounter (Signed)
Requested medications are due for refill today?  Yes  Requested medications are on active medication list? Yes  Last Refill:   06/01/2019  # 90 with no refills.    Future visit scheduled? No  Notes to Clinic:  Medication failed RX refill protocol due to no valid encounter in the past 6 months.  Last visit with provider was on 08/31/2017.

## 2019-11-16 NOTE — Telephone Encounter (Signed)
Patient has not been seen since 2019. He needs to schedule a follow up with Dr. Judithann Graves in the next few weeks to continue to get his medicines Refilled.  Please call pt to schedule an appt.   CM

## 2019-11-16 NOTE — Telephone Encounter (Signed)
Left a message to call back and set up appt

## 2019-12-19 ENCOUNTER — Other Ambulatory Visit: Payer: Self-pay | Admitting: Internal Medicine

## 2019-12-19 DIAGNOSIS — F419 Anxiety disorder, unspecified: Secondary | ICD-10-CM

## 2020-01-03 IMAGING — CR DG CHEST 2V
1 series · 2 of 2 positions shown · non-contrast
Comparison: 07/02/2006

CLINICAL DATA: Chest pain over the last few days.

EXAM:
CHEST - 2 VIEW

[Series 1: dg chest 2 view · 0.14mm/px · 2 of 2 slices shown]
[im 1/2]
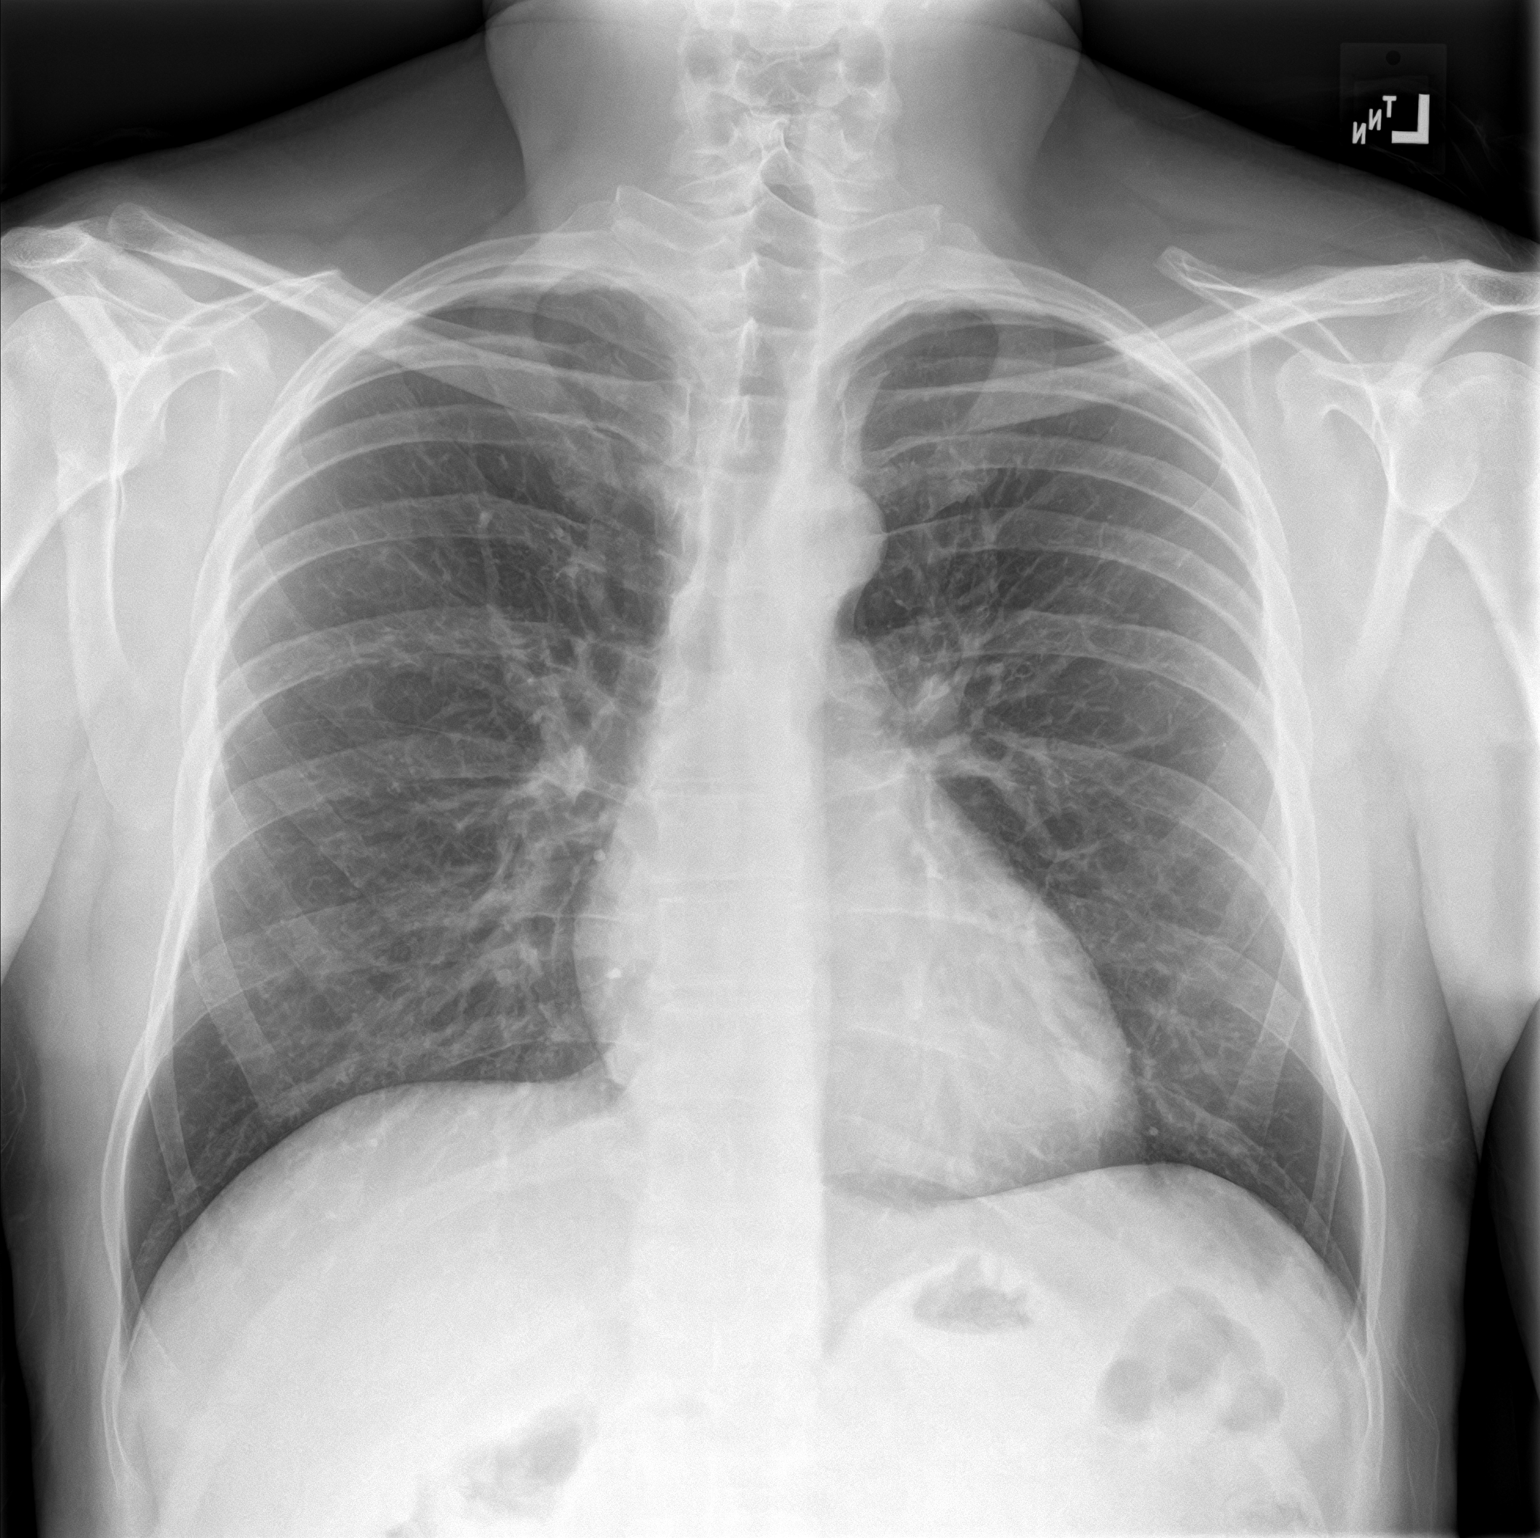
[im 2/2]
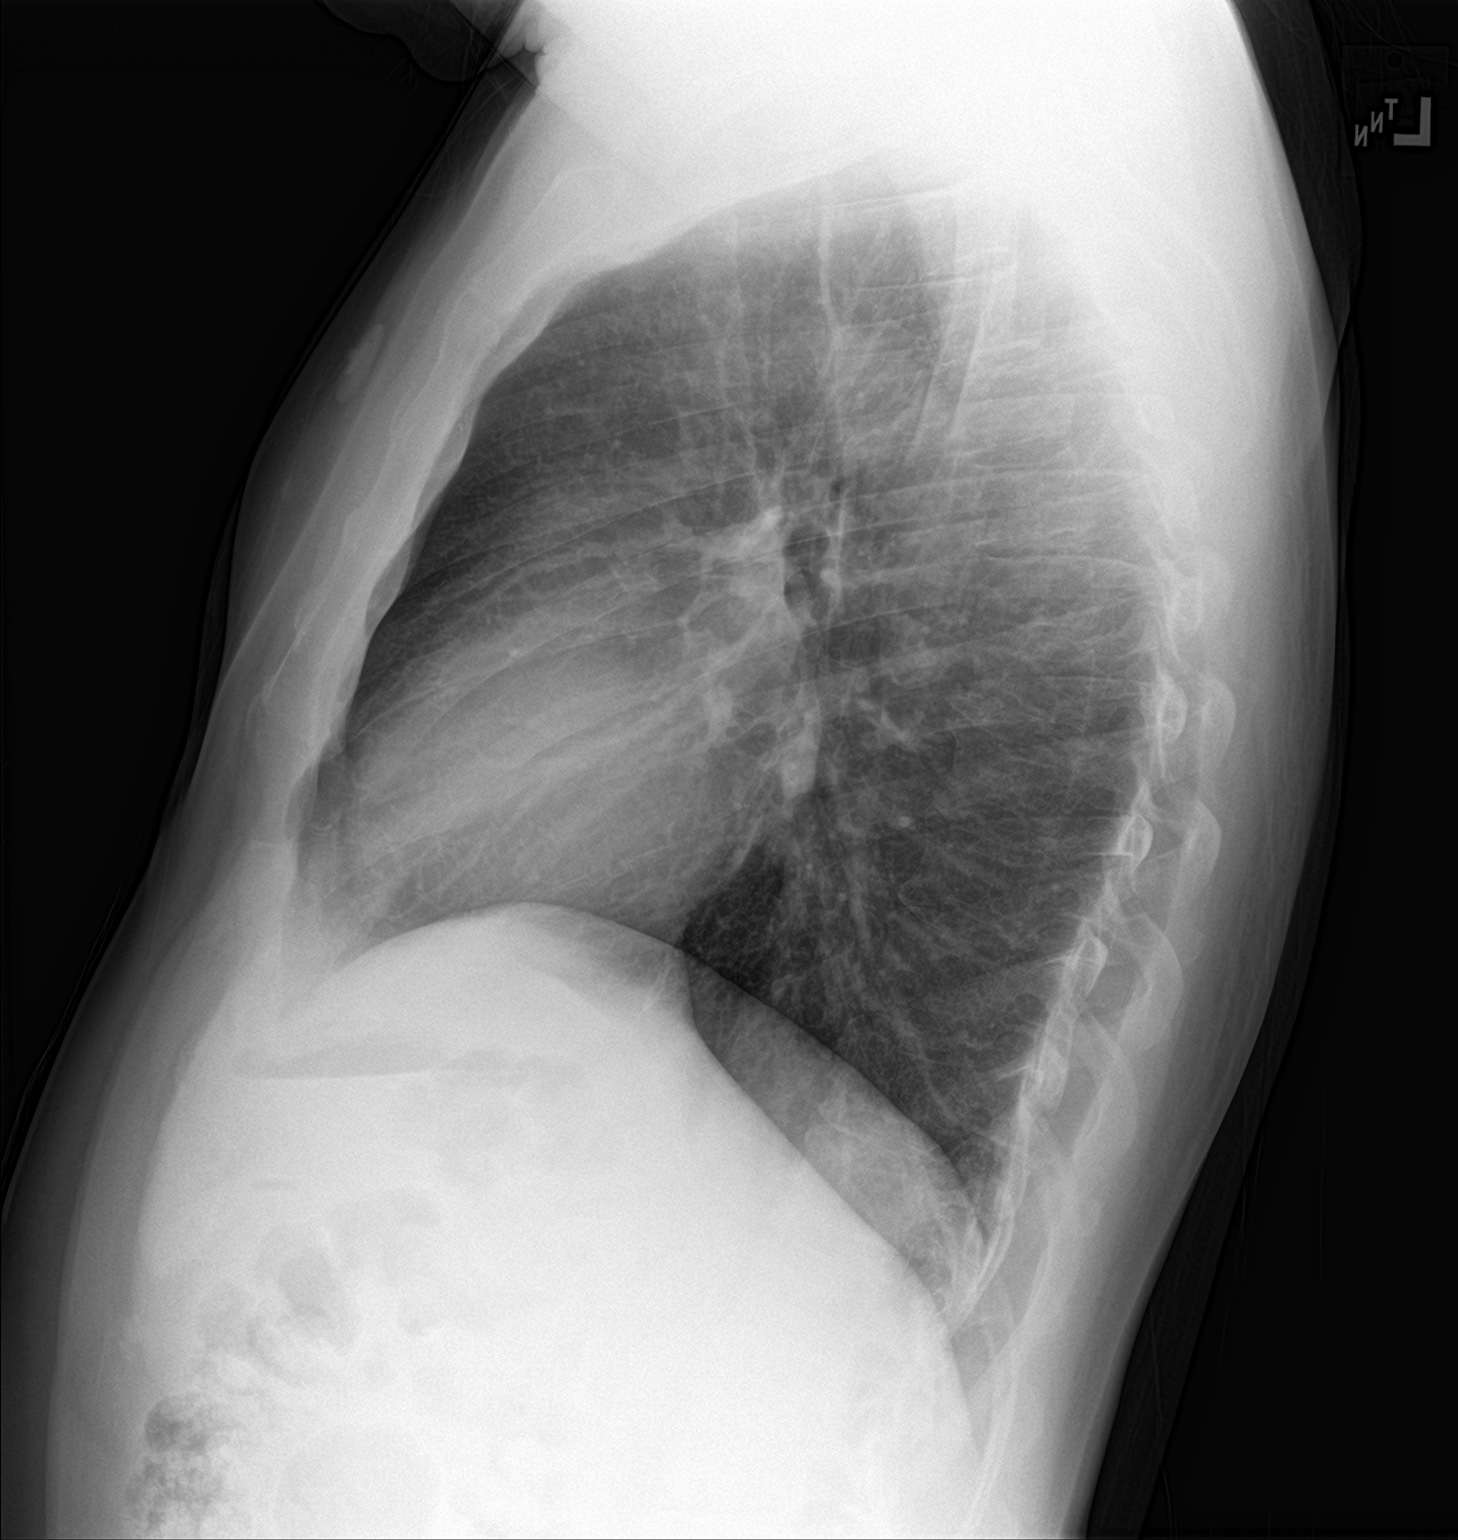

[2 of 2 positions shown; findings below may reference images not displayed]

FINDINGS: Heart size is normal. Mediastinal shadows are normal. The lungs are
clear. No bronchial thickening. No infiltrate, mass, effusion or
collapse. Pulmonary vascularity is normal. No bony abnormality.
IMPRESSION: Normal chest

## 2020-01-11 ENCOUNTER — Other Ambulatory Visit: Payer: Self-pay | Admitting: Internal Medicine

## 2020-01-11 DIAGNOSIS — F419 Anxiety disorder, unspecified: Secondary | ICD-10-CM

## 2020-01-11 NOTE — Telephone Encounter (Signed)
Requested medication (s) are due for refill today:  Yes  Requested medication (s) are on the active medication list:  Yes  Future visit scheduled:  No  Last Refill: 11/16/19; #30; no refills  Notes to Clinic:  last office appt. With Dr. Judithann Graves 08/2017.  Attempted to call pt.  Left vm to call office to schedule appt, in order to continue to get refills authorized.   Requested Prescriptions  Pending Prescriptions Disp Refills   citalopram (CELEXA) 20 MG tablet [Pharmacy Med Name: CITALOPRAM HBR 20 MG TABLET] 30 tablet 0    Sig: TAKE 1 TABLET BY MOUTH EVERY DAY      Psychiatry:  Antidepressants - SSRI Failed - 01/11/2020  2:09 PM      Failed - Valid encounter within last 6 months    Recent Outpatient Visits           2 years ago NSTEMI (non-ST elevated myocardial infarction) [facility])   [facility] Reubin Milan, MD   3 years ago Elevated blood pressure reading   [facility] Reubin Milan, MD   3 years ago Anxiety disorder, unspecified type   [facility] Reubin Milan, MD   3 years ago Anxiety disorder, unspecified type   [facility] Reubin Milan, MD

## 2020-01-12 ENCOUNTER — Telehealth: Payer: Self-pay | Admitting: Internal Medicine

## 2020-01-12 ENCOUNTER — Other Ambulatory Visit: Payer: Self-pay

## 2020-01-12 MED ORDER — LOSARTAN POTASSIUM 50 MG PO TABS: ORAL_TABLET | ORAL | 0 refills | Status: DC

## 2020-01-12 NOTE — Telephone Encounter (Signed)
*  STAT* If patient is at the pharmacy, call can be transferred to refill team.   1. Which medications need to be refilled? (please list name of each medication and dose if known) losartan (COZAAR) 50 MG 1 tablet daily   2. Which pharmacy/location (including street and city if local pharmacy) is medication to be sent to? [facility] in [place]  3. Do they need a 30 day or 90 day supply? 90 day   Patient scheduled an appt for 01/13/20

## 2020-01-12 NOTE — Telephone Encounter (Signed)
Refill sent as requested. 

## 2020-01-13 ENCOUNTER — Other Ambulatory Visit: Payer: Self-pay

## 2020-01-13 ENCOUNTER — Encounter: Payer: Self-pay | Admitting: Internal Medicine

## 2020-01-13 ENCOUNTER — Ambulatory Visit: Payer: 59 | Admitting: Internal Medicine

## 2020-01-13 VITALS — BP 120/74 | HR 56 | Ht 71.0 in | Wt 172.0 lb

## 2020-01-13 DIAGNOSIS — I10 Essential (primary) hypertension: Secondary | ICD-10-CM

## 2020-01-13 DIAGNOSIS — I2510 Atherosclerotic heart disease of native coronary artery without angina pectoris: Secondary | ICD-10-CM

## 2020-01-13 DIAGNOSIS — E785 Hyperlipidemia, unspecified: Secondary | ICD-10-CM

## 2020-01-13 MED ORDER — ATORVASTATIN CALCIUM 80 MG PO TABS: 80.0000 mg | ORAL_TABLET | Freq: Every day | ORAL | 2 refills | Status: DC

## 2020-01-13 MED ORDER — ASPIRIN EC 81 MG PO TBEC: 81.0000 mg | DELAYED_RELEASE_TABLET | Freq: Every day | ORAL | 3 refills | Status: AC

## 2020-01-13 MED ORDER — LOSARTAN POTASSIUM 50 MG PO TABS: ORAL_TABLET | ORAL | 2 refills | Status: DC

## 2020-01-13 MED ORDER — NITROGLYCERIN 0.4 MG SL SUBL: 0.4000 mg | SUBLINGUAL_TABLET | SUBLINGUAL | 2 refills | Status: DC | PRN

## 2020-01-13 NOTE — Patient Instructions (Signed)
Medication Instructions:  Your physician has recommended you make the following change in your medication:  1- START Aspirin 81 mg by mouth once a day. 2- START Atorvastatin 80 mg by mouth once a day.  *If you need a refill on your cardiac medications before your next appointment, please call your pharmacy*  Lab Work: Your physician recommends that you return for lab work in: 3 MONTHS AT THE [facility] MALL TO CHECK CHOLESTEROL.  LIPID AND CMET.  - You will need to be fasting. Please do not have anything to eat or drink after midnight the morning you have the lab work. You may only have water or black coffee with no cream or sugar. - Please go to the [facility]. You will check in at the front desk to the right as you walk into the atrium. Valet Parking is offered if needed. - No appointment needed. You may go any day between 7 am and 6 pm.  If you have labs (blood work) drawn today and your tests are completely normal, you will receive your results only by: Marland Kitchen MyChart Message (if you have MyChart) OR . A paper copy in the mail If you have any lab test that is abnormal or we need to change your treatment, we will call you to review the results.  Testing/Procedures: none  Follow-Up: At [facility] you and your health needs are our priority.  As part of our continuing mission to provide you with exceptional heart care, we have created designated Provider Care Teams.  These Care Teams include your primary Cardiologist (physician) and Advanced Practice Providers (APPs -  Physician Assistants and Nurse Practitioners) who all work together to provide you with the care you need, when you need it.  We recommend signing up for the patient portal called "MyChart".  Sign up information is provided on this After Visit Summary.  MyChart is used to connect with patients for Virtual Visits (Telemedicine).  Patients are able to view lab/test results, encounter notes, upcoming appointments, etc.   Non-urgent messages can be sent to your provider as well.   To learn more about what you can do with MyChart, go to [email or web address].    Your next appointment:   6 month(s)  The format for your next appointment:   In Person  Provider:    You may see Yvonne Kendall, MD or one of the following Advanced Practice Providers on your designated Care Team:    Nicolasa Ducking, NP  Eula Listen, PA-C  Marisue Ivan, PA-C

## 2020-01-13 NOTE — Progress Notes (Signed)
Follow-up Outpatient Visit Date: 01/13/2020  Primary Care Provider: Reubin Milan, MD [address] [place] [address]  Chief Complaint: Follow-up coronary artery disease  HPI:  Mr. Ellender is a 49 y.o. male with history of CAD with NSTEMI status post PCI to RCA (4/19), hypertension, tobacco use, and anxiety, who presents for follow-up of CAD.  We last spoke via virtual visit in April, at which time patient was feeling well.  We agreed to discontinue ticagrelor, as 12 months of DAPT has been completed.  Lipid panel this summer showed LDL at goal.  Minimal elevation in ALT level still noted.  Today, Mr. Mankins reports that he feels well.   He notes an occasional "pull" in the left side of his chest, usually when he is stressed.  It only lasts for a few seconds and is not associated with any other symptoms.  It feels different than what he experienced at the time of his NSTEMI in 2019.  He exercises regularly without any symptoms.  He denies dyspnea, palpitations, lightheadedness, and edema.  He has stopped taking aspirin and atorvastatin since our last visit, though he was tolerating both well.  --------------------------------------------------------------------------------------------------  Past Medical History:  Diagnosis Date  . Anxiety   . CAD (coronary artery disease)    a. 08/2017 NSTEMI/PCI: LM nl, LAD 30ost, 64m, D1 50ost, RI nl, LCX small/nl, RCA 70p/21m (3.0x28 [place] DES), EF 65%.  . History of echocardiogram    a. 08/2017 Echo: EF 65-70%, no rwma, mildly dil LA.  Marland Kitchen Hyperlipidemia   . Hypertension   . Tobacco abuse    Past Medical History:  Diagnosis Date  . Anxiety   . CAD (coronary artery disease)    a. 08/2017 NSTEMI/PCI: LM nl, LAD 30ost, 2m, D1 50ost, RI nl, LCX small/nl, RCA 70p/65m (3.0x28 [place] DES), EF 65%.  . History of echocardiogram    a. 08/2017 Echo: EF 65-70%, no rwma, mildly dil LA.  Marland Kitchen Hyperlipidemia   . Hypertension   . Tobacco abuse      Current Meds  Medication Sig  . carvedilol (COREG) 6.25 MG tablet TAKE 1 TABLET (6.25 MG TOTAL) BY MOUTH 2 (TWO) TIMES DAILY WITH A MEAL.  . citalopram (CELEXA) 20 MG tablet TAKE 1 TABLET BY MOUTH EVERY DAY  . losartan (COZAAR) 50 MG tablet TAKE 1 TABLET BY MOUTH EVERY DAY APPOINTMENT NEEDED FOR FURTHER REFILLS. THANK YOU!  . nitroGLYCERIN (NITROSTAT) 0.4 MG SL tablet Place 1 tablet (0.4 mg total) under the tongue every 5 (five) minutes as needed for chest pain.    Allergies: Amoxicillin and Penicillins  Social History   Tobacco Use  . Smoking status: Current Every Day Smoker    Packs/day: 0.25    Years: 20.00    Pack years: 5.00    Types: Cigarettes  . Smokeless tobacco: Former Neurosurgeon  . Tobacco comment: 09/30/17 down to cigarettes in last 24 hrs  Vaping Use  . Vaping Use: Never used  Substance Use Topics  . Alcohol use: Yes    Comment: few drinks/week  . Drug use: Yes    Types: Marijuana    Comment: smoked mj for first time in 20 yrs about 1 wk ago.    Family History  Problem Relation Age of Onset  . Hypertension Mother   . Diabetes Father   . Heart attack Father        first MI in mid-50's  . CAD Father  s/p CABG  . Diabetes Brother   . Cancer Paternal Grandfather     Review of Systems: A 12-system review of systems was performed and was negative except as noted in the HPI.  --------------------------------------------------------------------------------------------------  Physical Exam: BP 120/74 (BP Location: Left Arm, Patient Position: Sitting, Cuff Size: Normal)   Pulse (!) 56   Ht 5\' 11"  (1.803 m)   Wt 172 lb (78 kg)   BMI 23.99 kg/m   General:  NAD. HEENT: No conjunctival pallor or scleral icterus. Facemask in place. Neck: Supple without lymphadenopathy, thyromegaly, JVD, or HJR. Lungs: Normal work of breathing. Clear to auscultation bilaterally without wheezes or crackles. Heart: Bradycardic but regular without murmurs, rubs, or gallops.  Non-displaced PMI. Abd: Bowel sounds present. Soft, NT/ND without hepatosplenomegaly Ext: No lower extremity edema. Radial, PT, and DP pulses are 2+ bilaterally. Skin: Warm and dry without rash.  EKG:  Sinus bradycardia (HR 56 bpm).  Otherwise, no significant abnormality.  Lab Results  Component Value Date   WBC 7.9 08/20/2017   HGB 14.0 08/20/2017   HCT 40.9 08/20/2017   MCV 94.4 08/20/2017   PLT 208 08/20/2017    Lab Results  Component Value Date   NA 139 08/21/2017   K 3.8 08/21/2017   CL 105 08/21/2017   CO2 28 08/21/2017   BUN 24 (H) 08/21/2017   CREATININE 0.79 08/21/2017   GLUCOSE 102 (H) 08/21/2017   ALT 45 (H) 01/12/2018    Lab Results  Component Value Date   CHOL 129 11/25/2017   HDL 44 11/25/2017   LDLCALC 56 11/25/2017   TRIG 143 11/25/2017   CHOLHDL 2.9 11/25/2017    --------------------------------------------------------------------------------------------------  ASSESSMENT AND PLAN: Coronary artery disease: Mr. Butler reports intermittent atypical chest pain that is not exertional and is distinctly different than what he felt at the time of his NSTEMI.  EKG and exam today are normal.  We have agreed to defer further testing at this time, though Mr. Molesworth should alert Jae Dire if the pain becomes more frequent or severe so that we can readdress stress testing.  I have advised him to restart aspirin 81 mg daily and atorvastatin 80 mg daily for secondary prevention.  Hyperlipidemia: LDL well controlled on last check while still on atorvastatin.  I have advised him to restart this medication, as he was tolerating it well.  We will plan to recheck a lipid panel and ALT in ~3 months.  Hypertension: Blood pressure well-controlled today.  We will continue losartan 50 mg daily.  Follow-up: Return to clinic in 6 months.  [place], MD 01/13/2020 8:59 AM

## 2020-07-18 ENCOUNTER — Encounter: Payer: Self-pay | Admitting: Internal Medicine

## 2020-07-18 ENCOUNTER — Ambulatory Visit: Payer: 59 | Admitting: Internal Medicine

## 2020-07-18 ENCOUNTER — Other Ambulatory Visit: Payer: Self-pay

## 2020-07-18 VITALS — BP 120/70 | HR 63 | Ht 71.0 in | Wt 162.2 lb

## 2020-07-18 DIAGNOSIS — I2510 Atherosclerotic heart disease of native coronary artery without angina pectoris: Secondary | ICD-10-CM

## 2020-07-18 DIAGNOSIS — Z720 Tobacco use: Secondary | ICD-10-CM

## 2020-07-18 DIAGNOSIS — Z79899 Other long term (current) drug therapy: Secondary | ICD-10-CM

## 2020-07-18 DIAGNOSIS — E785 Hyperlipidemia, unspecified: Secondary | ICD-10-CM | POA: Diagnosis not present

## 2020-07-18 DIAGNOSIS — I10 Essential (primary) hypertension: Secondary | ICD-10-CM | POA: Diagnosis not present

## 2020-07-18 MED ORDER — TELMISARTAN 40 MG PO TABS: 40.0000 mg | ORAL_TABLET | Freq: Every day | ORAL | 3 refills | Status: DC

## 2020-07-18 NOTE — Progress Notes (Signed)
Follow-up Outpatient Visit Date: 07/18/2020  Primary Care Provider: Reubin Milan, MD [address] [place] [address]  Chief Complaint: Follow-up coronary artery disease  HPI:  Mr. Nathan Gray is a 50 y.o. male with history of  CAD with NSTEMI status post PCI to RCA (4/19), hypertension, tobacco use, and anxiety, who presents for follow-up of coronary artery disease. I last saw him in late 12/2019, at which time Mr. Nathan Gray was overall feeling well. He reported an occasional "pull" in the left side of his chest when stressed. Discomfort would only last for a few seconds and was different than what he experienced at the time of his NSTEMI. We deferred additional testing. I asked him to restart low-dose aspirin and atorvastatin for secondary prevention.  Today, Mr. Nathan Gray reports that he is feeling well.  He has not had any chest pain, shortness of breath, palpitations, lightheadedness, or edema.  He is working out regularly.  He had a brief episode of left posterior thigh pain after increasing his weights during exercise.  He has not had any exertional pain in his legs to suggest claudication.  Mr. Nathan Gray has started smoking again, less than 1/2 pack/day.  He was previously successful with Chantix and inquires about trying this again.  He is currently using nicotine gum from time to time to help with cravings.  --------------------------------------------------------------------------------------------------  Past Medical History:  Diagnosis Date  . Anxiety   . CAD (coronary artery disease)    a. 08/2017 NSTEMI/PCI: LM nl, LAD 30ost, 75m, D1 50ost, RI nl, LCX small/nl, RCA 70p/7m (3.0x28 [place] DES), EF 65%.  . History of echocardiogram    a. 08/2017 Echo: EF 65-70%, no rwma, mildly dil LA.  Marland Kitchen Hyperlipidemia   . Hypertension   . Tobacco abuse    Past Surgical History:  Procedure Laterality Date  . APPENDECTOMY    . CORONARY STENT INTERVENTION N/A 08/19/2017   Procedure:  CORONARY STENT INTERVENTION;  Surgeon: Yvonne Kendall, MD;  Location: [facility] INVASIVE CV LAB;  Service: Cardiovascular;  Laterality: N/A;  . LEFT HEART CATH AND CORONARY ANGIOGRAPHY N/A 08/19/2017   Procedure: LEFT HEART CATH AND CORONARY ANGIOGRAPHY;  Surgeon: Yvonne Kendall, MD;  Location: [facility] INVASIVE CV LAB;  Service: Cardiovascular;  Laterality: N/A;    Current Meds  Medication Sig  . aspirin EC 81 MG tablet Take 1 tablet (81 mg total) by mouth daily. Swallow whole.  Marland Kitchen atorvastatin (LIPITOR) 80 MG tablet Take 1 tablet (80 mg total) by mouth daily.  . carvedilol (COREG) 6.25 MG tablet TAKE 1 TABLET (6.25 MG TOTAL) BY MOUTH 2 (TWO) TIMES DAILY WITH A MEAL.  . nitroGLYCERIN (NITROSTAT) 0.4 MG SL tablet Place 1 tablet (0.4 mg total) under the tongue every 5 (five) minutes as needed for chest pain.  Marland Kitchen telmisartan (MICARDIS) 40 MG tablet Take 1 tablet (40 mg total) by mouth daily.  . [DISCONTINUED] losartan (COZAAR) 50 MG tablet TAKE 1 TABLET BY MOUTH EVERY DAY.    Allergies: Amoxicillin and Penicillins  Social History   Tobacco Use  . Smoking status: Current Every Day Smoker    Packs/day: 0.25    Years: 20.00    Pack years: 5.00    Types: Cigarettes  . Smokeless tobacco: Former Neurosurgeon  . Tobacco comment: 09/30/17 down to cigarettes in last 24 hrs  Vaping Use  . Vaping Use: Never used  Substance Use Topics  . Alcohol use: Yes    Comment: few drinks/week  . Drug use: Yes  Types: Marijuana    Comment: smoked mj for first time in 20 yrs about 1 wk ago.    Family History  Problem Relation Age of Onset  . Hypertension Mother   . Diabetes Father   . Heart attack Father        first MI in mid-50's  . CAD Father        s/p CABG  . Diabetes Brother   . Cancer Paternal Grandfather     Review of Systems: A 12-system review of systems was performed and was negative except as noted in the  HPI.  --------------------------------------------------------------------------------------------------  Physical Exam: BP 120/70 (BP Location: Left Arm, Patient Position: Sitting, Cuff Size: Normal)   Pulse 63   Ht 5\' 11"  (1.803 m)   Wt 162 lb 4 oz (73.6 kg)   SpO2 98%   BMI 22.63 kg/m   General:  NAD. Neck: No JVD or HJR. Lungs: Clear to auscultation bilaterally without wheezes or crackles. Heart: Regular rate and rhythm without murmurs, rubs, or gallops. Abdomen: Soft, nontender, nondistended. Extremities: No lower extremity edema.  EKG:  Normal sinus rhythm without abnormality.  Lab Results  Component Value Date   WBC 7.9 08/20/2017   HGB 14.0 08/20/2017   HCT 40.9 08/20/2017   MCV 94.4 08/20/2017   PLT 208 08/20/2017    Lab Results  Component Value Date   NA 139 08/21/2017   K 3.8 08/21/2017   CL 105 08/21/2017   CO2 28 08/21/2017   BUN 24 (H) 08/21/2017   CREATININE 0.79 08/21/2017   GLUCOSE 102 (H) 08/21/2017   ALT 45 (H) 01/12/2018    Lab Results  Component Value Date   CHOL 129 11/25/2017   HDL 44 11/25/2017   LDLCALC 56 11/25/2017   TRIG 143 11/25/2017   CHOLHDL 2.9 11/25/2017    --------------------------------------------------------------------------------------------------  ASSESSMENT AND PLAN: Coronary artery disease without angina: Mr. 01/26/2018 continues to do well without recurrent angina.  Continue current medications for secondary prevention including indefinite aspirin 81 mg daily and high intensity statin therapy.  We will check a lipid panel and CBC today in the setting of statin and antiplatelet therapy.  Hypertension: Blood pressure well controlled today.  Nathan Gray notes that losartan is currently on backorder and that he has about 2 weeks left.  We will transition him to telmisartan 40 mg daily.  Continue current dose of carvedilol.  I will check a CMP today to ensure appropriate renal function and  electrolytes.  Hyperlipidemia: Nathan Gray is tolerating atorvastatin 80 mg daily well.  I will check a CMP and fasting lipid panel today.  If LDL is very well controlled, we could consider de-escalation of atorvastatin.  Tobacco abuse: Unfortunately, Nathan Gray has started smoking again.  He is interested in quitting.  We discussed that Chantix is currently not available.  Other options would include continued nicotine replacement and bupropion.  Mr. Lowden would like to continue using as needed nicotine replacement and is confident that he will be able to quit. He was counseled on tobacco cessation today for 4 minutes.  Counseling included reviewing the risks of smoking tobacco products, how it impacts the patient's current medical diagnoses and different strategies for quitting.  Pharmacotherapy to aid in tobacco cessation was not prescribed today.  Follow-up: Return to clinic in 1 year.  Jae Dire, MD 07/18/2020 10:09 AM

## 2020-07-18 NOTE — Patient Instructions (Signed)
Medication Instructions:  Your physician has recommended you make the following change in your medication:  1- STOP Losartan. 2- START Telmisartan 40 mg by mouth once a day.  *If you need a refill on your cardiac medications before your next appointment, please call your pharmacy*  Lab Work: Your physician recommends that you return for lab work in: TODAY - CBC, CMP, LIPID.  If you have labs (blood work) drawn today and your tests are completely normal, you will receive your results only by: Marland Kitchen MyChart Message (if you have MyChart) OR . A paper copy in the mail If you have any lab test that is abnormal or we need to change your treatment, we will call you to review the results.  Testing/Procedures: none  Follow-Up: At [facility], you and your health needs are our priority.  As part of our continuing mission to provide you with exceptional heart care, we have created designated Provider Care Teams.  These Care Teams include your primary Cardiologist (physician) and Advanced Practice Providers (APPs -  Physician Assistants and Nurse Practitioners) who all work together to provide you with the care you need, when you need it.  We recommend signing up for the patient portal called "MyChart".  Sign up information is provided on this After Visit Summary.  MyChart is used to connect with patients for Virtual Visits (Telemedicine).  Patients are able to view lab/test results, encounter notes, upcoming appointments, etc.  Non-urgent messages can be sent to your provider as well.   To learn more about what you can do with MyChart, go to [email or web address].    Your next appointment:   12 month(s)  The format for your next appointment:   In Person  Provider:   You may see Yvonne Kendall, MD or one of the following Advanced Practice Providers on your designated Care Team:    Nicolasa Ducking, NP  Eula Listen, PA-C  Marisue Ivan, PA-C  Cadence [place], [place]  Gillian Shields,  NP

## 2020-07-19 LAB — COMPREHENSIVE METABOLIC PANEL
ALT: 44 IU/L (ref 0–44)
AST: 34 IU/L (ref 0–40)
Albumin/Globulin Ratio: 1.6 (ref 1.2–2.2)
Albumin: 4.4 g/dL (ref 4.0–5.0)
Alkaline Phosphatase: 111 IU/L (ref 44–121)
BUN/Creatinine Ratio: 19 (ref 9–20)
BUN: 18 mg/dL (ref 6–24)
Bilirubin Total: 0.3 mg/dL (ref 0.0–1.2)
CO2: 20 mmol/L (ref 20–29)
Calcium: 9.1 mg/dL (ref 8.7–10.2)
Chloride: 104 mmol/L (ref 96–106)
Creatinine, Ser: 0.95 mg/dL (ref 0.76–1.27)
Globulin, Total: 2.7 g/dL (ref 1.5–4.5)
Glucose: 92 mg/dL (ref 65–99)
Potassium: 4.4 mmol/L (ref 3.5–5.2)
Sodium: 138 mmol/L (ref 134–144)
Total Protein: 7.1 g/dL (ref 6.0–8.5)
eGFR: 99 mL/min/{1.73_m2} (ref >59)

## 2020-07-19 LAB — CBC
Hematocrit: 38.4 % (ref 37.5–51.0)
Hemoglobin: 13.1 g/dL (ref 13.0–17.7)
MCH: 31.4 pg (ref 26.6–33.0)
MCHC: 34.1 g/dL (ref 31.5–35.7)
MCV: 92 fL (ref 79–97)
Platelets: 215 10*3/uL (ref 150–450)
RBC: 4.17 x10E6/uL (ref 4.14–5.80)
RDW: 12.7 % (ref 11.6–15.4)
WBC: 6.3 10*3/uL (ref 3.4–10.8)

## 2020-07-19 LAB — LIPID PANEL
Chol/HDL Ratio: 3.7 ratio (ref 0.0–5.0)
Cholesterol, Total: 183 mg/dL (ref 100–199)
HDL: 49 mg/dL (ref >39)
LDL Chol Calc (NIH): 118 mg/dL — ABNORMAL HIGH (ref 0–99)
Triglycerides: 85 mg/dL (ref 0–149)
VLDL Cholesterol Cal: 16 mg/dL (ref 5–40)

## 2020-07-20 ENCOUNTER — Telehealth: Payer: Self-pay | Admitting: *Deleted

## 2020-07-20 DIAGNOSIS — I2510 Atherosclerotic heart disease of native coronary artery without angina pectoris: Secondary | ICD-10-CM

## 2020-07-20 DIAGNOSIS — E785 Hyperlipidemia, unspecified: Secondary | ICD-10-CM

## 2020-07-20 NOTE — Telephone Encounter (Signed)
-----   Message from Yvonne Kendall, MD sent at 07/20/2020  9:22 AM EST ----- Please let Mr. Perusse know that his blood counts, kidney function, and liver function are normal.  His cholesterol is suboptimally controlled with an LDL of 118 (goal < 70).  Please confirm that he has been taking atorvastatin 80 mg daily as prescribed.  If not, I recommend that he restart it.  If he has been compliant with atorvastatin, I recommend switching to rosuvastatin 40 mg daily.  In either case, we should repeat a lipid panel and ALT in ~3 months.

## 2020-07-20 NOTE — Telephone Encounter (Signed)
Left voicemail message to call back for review of results.  

## 2020-07-20 NOTE — Telephone Encounter (Signed)
Reviewed results with patient and he has not been taking his atorvastatin. Requested that he please start taking that medication and then we can do repeat labs in 3 months. Reviewed to not eat or drink anything after midnight except sip of water with pills. Reviewed that he can go to [facility] entrance to have repeat labs done after restarting medication. He verbalized understanding of results and recommendations with no further questions at this time. Labs entered for June and he was agreeable to have those done.

## 2020-10-19 ENCOUNTER — Other Ambulatory Visit: Payer: Self-pay | Admitting: Internal Medicine

## 2020-11-05 ENCOUNTER — Other Ambulatory Visit: Payer: Self-pay | Admitting: Internal Medicine

## 2021-07-22 ENCOUNTER — Other Ambulatory Visit: Payer: Self-pay | Admitting: Internal Medicine

## 2021-07-22 NOTE — Telephone Encounter (Signed)
Please schedule 12 month F/U appointment. Thank you! 

## 2021-07-23 NOTE — Telephone Encounter (Signed)
LVM to schedule

## 2021-07-24 NOTE — Telephone Encounter (Signed)
Attempted to schedule.  

## 2021-07-24 NOTE — Telephone Encounter (Signed)
Scheduled 08/21/21

## 2021-07-25 ENCOUNTER — Encounter: Payer: Self-pay | Admitting: Cardiovascular Disease

## 2021-07-25 ENCOUNTER — Other Ambulatory Visit: Payer: Self-pay

## 2021-07-25 ENCOUNTER — Ambulatory Visit: Payer: 59 | Admitting: Cardiovascular Disease

## 2021-07-25 VITALS — BP 120/78 | HR 83 | Ht 71.0 in | Wt 155.0 lb

## 2021-07-25 DIAGNOSIS — I10 Essential (primary) hypertension: Secondary | ICD-10-CM

## 2021-07-25 DIAGNOSIS — I2510 Atherosclerotic heart disease of native coronary artery without angina pectoris: Secondary | ICD-10-CM | POA: Diagnosis not present

## 2021-07-25 DIAGNOSIS — Z720 Tobacco use: Secondary | ICD-10-CM

## 2021-07-25 DIAGNOSIS — E785 Hyperlipidemia, unspecified: Secondary | ICD-10-CM | POA: Diagnosis not present

## 2021-07-25 MED ORDER — TELMISARTAN 40 MG PO TABS: 40.0000 mg | ORAL_TABLET | Freq: Every day | ORAL | 3 refills | Status: DC

## 2021-07-25 MED ORDER — ATORVASTATIN CALCIUM 80 MG PO TABS: 80.0000 mg | ORAL_TABLET | Freq: Every day | ORAL | 3 refills | Status: DC

## 2021-07-25 MED ORDER — CARVEDILOL 6.25 MG PO TABS: 6.2500 mg | ORAL_TABLET | Freq: Two times a day (BID) | ORAL | 3 refills | Status: DC

## 2021-07-25 NOTE — Progress Notes (Signed)
Cardiology Office Note ? ?Date:  07/25/2021  ? ?ID:  Nathan Gray, DOB Aug 13, 1970, MRN [id number] ? ?PCP:  Nathan Hess, MD  ? ?Chief Complaint  ?Patient presents with  ? Follow-up  ?  12 month F/U-No new cardiac concerns  ? ? ?HPI:  ? Nathan Gray is a 51 y.o. male with history of   ?CAD with NSTEMI status post PCI to RCA (4/19),  ?hypertension,  ?tobacco use, ?anxiety,  ?who presents for follow-up of coronary artery disease.  ? ?On last clinic visit was smoking, ?Continues to struggle, trying to quit ?Previous success with Chantix ? ?Works out almost every day does weights, aerobic ?Dog walks daily sometimes up to 2 miles ?Denies any chest pain or shortness of breath concerning for angina ? ? ?Discussed labs from last year, due for repeat labs ?Unclear if he was on Lipitor at this time, feels it was from his diet that made his numbers go up ?Lab Results  ?Component Value Date  ? CHOL 183 07/18/2020  ? HDL 49 07/18/2020  ? LDLCALC 118 (H) 07/18/2020  ? TRIG 85 07/18/2020  ?  ? ?Long discussion concerning his family history ?Father cad, CABG , DM, died 78 ?Brother died 2020/09/22 ? ?EKG personally reviewed by myself on todays visit ?Normal sinus rhythm with rate 83 bpm no significant ST-T wave changes ? ?PMH:   has a past medical history of Anxiety, CAD (coronary artery disease), History of echocardiogram, Hyperlipidemia, Hypertension, and Tobacco abuse. ? ?PSH:    ?Past Surgical History:  ?Procedure Laterality Date  ? APPENDECTOMY    ? CORONARY STENT INTERVENTION N/A 08/19/2017  ? Procedure: CORONARY STENT INTERVENTION;  Surgeon: Nelva Bush, MD;  Location: [facility];  Service: Cardiovascular;  Laterality: N/A;  ? LEFT HEART CATH AND CORONARY ANGIOGRAPHY N/A 08/19/2017  ? Procedure: LEFT HEART CATH AND CORONARY ANGIOGRAPHY;  Surgeon: Nelva Bush, MD;  Location: [facility];  Service: Cardiovascular;  Laterality: N/A;  ? ? ?Current Outpatient Medications  ?Medication Sig Dispense Refill  ?  aspirin EC 81 MG tablet Take 1 tablet (81 mg total) by mouth daily. Swallow whole. 90 tablet 3  ? nitroGLYCERIN (NITROSTAT) 0.4 MG SL tablet Place 1 tablet (0.4 mg total) under the tongue every 5 (five) minutes as needed for chest pain. 25 tablet 2  ? atorvastatin (LIPITOR) 80 MG tablet Take 1 tablet (80 mg total) by mouth daily. 90 tablet 3  ? carvedilol (COREG) 6.25 MG tablet Take 1 tablet (6.25 mg total) by mouth 2 (two) times daily with a meal. 180 tablet 3  ? telmisartan (MICARDIS) 40 MG tablet Take 1 tablet (40 mg total) by mouth daily. 90 tablet 3  ? ?No current facility-administered medications for this visit.  ? ? ? ?Allergies:   Amoxicillin and Penicillins  ? ?Social History:  The patient  reports that he has been smoking cigarettes. He has a 5.00 pack-year smoking history. He has quit using smokeless tobacco. He reports current alcohol use. He reports current drug use. Drug: Marijuana.  ? ?Family History:   family history includes CAD in his father; Cancer in his paternal grandfather; Diabetes in his brother and father; Heart attack in his father; Hypertension in his mother.  ? ? ?Review of Systems: ?Review of Systems  ?Constitutional: Negative.   ?HENT: Negative.    ?Respiratory: Negative.    ?Cardiovascular: Negative.   ?Gastrointestinal: Negative.   ?Musculoskeletal: Negative.   ?Neurological: Negative.   ?Psychiatric/Behavioral: Negative.    ?  All other systems reviewed and are negative. ? ? ?PHYSICAL EXAM: ?VS:  BP 120/78 (BP Location: Left Arm, Patient Position: Sitting, Cuff Size: Normal)   Pulse 83   Ht 5\' 11"  (1.803 m)   Wt 155 lb (70.3 kg)   SpO2 98%   BMI 21.62 kg/m?  , BMI Body mass index is 21.62 kg/m?. ?GEN: Well nourished, well developed, in no acute distress ?HEENT: normal ?Neck: no JVD, carotid bruits, or masses ?Cardiac: RRR; no murmurs, rubs, or gallops,no edema  ?Respiratory:  clear to auscultation bilaterally, normal work of breathing ?GI: soft, nontender, nondistended, + BS ?MS:  no deformity or atrophy ?Skin: warm and dry, no rash ?Neuro:  Strength and sensation are intact ?Psych: euthymic mood, full affect ? ? ? ?Recent Labs: ?No results found for requested labs within last 8760 hours.  ? ? ?Lipid Panel ?Lab Results  ?Component Value Date  ? CHOL 183 07/18/2020  ? HDL 49 07/18/2020  ? LDLCALC 118 (H) 07/18/2020  ? TRIG 85 07/18/2020  ? ?  ? ?Wt Readings from Last 3 Encounters:  ?07/25/21 155 lb (70.3 kg)  ?07/18/20 162 lb 4 oz (73.6 kg)  ?01/13/20 172 lb (78 kg)  ?  ? ? ? ?ASSESSMENT AND PLAN: ? ?Problem List Items Addressed This Visit   ? ?  ? Cardiology Problems  ? Coronary artery disease involving native coronary artery of native heart without angina pectoris - Primary  ? Relevant Medications  ? carvedilol (COREG) 6.25 MG tablet  ? atorvastatin (LIPITOR) 80 MG tablet  ? telmisartan (MICARDIS) 40 MG tablet  ? Other Relevant Orders  ? EKG 12-Lead  ? Lipid Profile  ? Hepatic function panel  ? Hyperlipidemia LDL goal <70  ? Relevant Medications  ? carvedilol (COREG) 6.25 MG tablet  ? atorvastatin (LIPITOR) 80 MG tablet  ? telmisartan (MICARDIS) 40 MG tablet  ? Other Relevant Orders  ? Lipid Profile  ? Hepatic function panel  ? Essential hypertension  ? Relevant Medications  ? carvedilol (COREG) 6.25 MG tablet  ? atorvastatin (LIPITOR) 80 MG tablet  ? telmisartan (MICARDIS) 40 MG tablet  ?  ? Other  ? Tobacco abuse  ? ?Coronary disease with stable angina ?Strong family history, father and brother have died ?Currently with no symptoms of angina. No further workup at this time. Continue current medication regimen. ? ?Essential hypertension ?Blood pressure is well controlled on today's visit. No changes made to the medications. ? ?Hyperlipidemia ?We have ordered lipid panel and LFTs at his convenience ? ?Smoking ?We have encouraged him to continue to work on weaning his cigarettes and smoking cessation. He will continue to work on this and does not want any assistance with chantix.   ? ? ?  Total encounter time more than 30 minutes ? Greater than 50% was spent in counseling and coordination of care with the patient ? ? ? ?Signed, ?Esmond Plants, M.D., Ph.D. ?[facility] [place], [place] ?[phone] ?

## 2021-07-25 NOTE — Patient Instructions (Addendum)
Medication Instructions:  ?No changes ? ?If you need a refill on your cardiac medications before your next appointment, please call your pharmacy.  ? ?Lab work: ? ?Your physician recommends that you return for FASTING lab work at your convenience: Lipid panel / LFT ? ?Testing/Procedures: ?No new testing needed ? ?Follow-Up: ?At [facility], you and your health needs are our priority.  As part of our continuing mission to provide you with exceptional heart care, we have created designated Provider Care Teams.  These Care Teams include your primary Cardiologist (physician) and Advanced Practice Providers (APPs -  Physician Assistants and Nurse Practitioners) who all work together to provide you with the care you need, when you need it. ? ?You will need a follow up appointment in 12 months with Dr. Okey Dupre ? ?Providers on your designated Care Team:   ?Nicolasa Ducking, NP ?Eula Listen, PA-C ?Cadence Fransico Michael, PA-C ? ?COVID-19 Vaccine Information can be found at: [email or web address].nl For questions related to vaccine distribution or appointments, please email [email or web address] or call [phone].  ? ?

## 2021-08-21 ENCOUNTER — Ambulatory Visit: Payer: 59 | Admitting: Internal Medicine

## 2021-08-21 ENCOUNTER — Other Ambulatory Visit (INDEPENDENT_AMBULATORY_CARE_PROVIDER_SITE_OTHER): Payer: 59

## 2021-08-21 DIAGNOSIS — I2510 Atherosclerotic heart disease of native coronary artery without angina pectoris: Secondary | ICD-10-CM

## 2021-08-21 DIAGNOSIS — E785 Hyperlipidemia, unspecified: Secondary | ICD-10-CM

## 2021-08-22 LAB — HEPATIC FUNCTION PANEL
ALT: 30 IU/L (ref 0–44)
AST: 26 IU/L (ref 0–40)
Albumin: 4.4 g/dL (ref 4.0–5.0)
Alkaline Phosphatase: 62 IU/L (ref 44–121)
Bilirubin Total: 0.3 mg/dL (ref 0.0–1.2)
Bilirubin, Direct: 0.11 mg/dL (ref 0.00–0.40)
Total Protein: 6.5 g/dL (ref 6.0–8.5)

## 2021-08-22 LAB — LIPID PANEL
Chol/HDL Ratio: 2.5 ratio (ref 0.0–5.0)
Cholesterol, Total: 114 mg/dL (ref 100–199)
HDL: 45 mg/dL (ref >39)
LDL Chol Calc (NIH): 58 mg/dL (ref 0–99)
Triglycerides: 44 mg/dL (ref 0–149)
VLDL Cholesterol Cal: 11 mg/dL (ref 5–40)

## 2021-08-26 ENCOUNTER — Telehealth: Payer: Self-pay | Admitting: Emergency Medicine

## 2021-08-26 NOTE — Telephone Encounter (Signed)
Called patient. No answer. Detailed message left per DPR 

## 2021-08-26 NOTE — Telephone Encounter (Signed)
-----   Message from Antonieta Iba, MD sent at 08/25/2021  1:28 PM EDT ----- ?Lab work reviewed ?Cholesterol at goal, normal liver function test  ?much improved numbers ?

## 2021-10-04 ENCOUNTER — Telehealth: Payer: Self-pay | Admitting: Internal Medicine

## 2021-10-04 NOTE — Telephone Encounter (Signed)
Form received. Dr. Mariah Milling last saw pt in March of this year and has completed and signed form.  Called to notify pt form ready for pick up and is at the front desk. Pt states he will return before 5 PM today to pick up.

## 2021-10-04 NOTE — Telephone Encounter (Signed)
Patient came by office  Dropped off [employer] form that he would need to be completed ASAP Would like to know if APP can fill out if Dr End not available Please call when ready

## 2022-07-25 ENCOUNTER — Other Ambulatory Visit: Payer: Self-pay | Admitting: Cardiovascular Disease

## 2022-07-25 NOTE — Telephone Encounter (Signed)
Good Morning,   Could you please schedule this patient a 12 month follow up visit? The patient was last seen by Dr. Rockey Situ 07-25-21 and he wanted the patient to follow up with Dr. Saunders Revel in 12 months. Thank you so much.

## 2022-07-25 NOTE — Telephone Encounter (Signed)
LVM to schedule schedule appt

## 2022-07-26 ENCOUNTER — Other Ambulatory Visit: Payer: Self-pay | Admitting: Cardiovascular Disease

## 2022-07-28 NOTE — Telephone Encounter (Signed)
Please contact pt for future appointment. ?Pt due for 12 month f/u. ?Pt needing refills. ?

## 2022-07-28 NOTE — Telephone Encounter (Signed)
LVM to schedule appt

## 2022-08-08 ENCOUNTER — Other Ambulatory Visit: Payer: Self-pay | Admitting: Cardiovascular Disease

## 2022-08-11 ENCOUNTER — Other Ambulatory Visit: Payer: Self-pay

## 2022-08-11 ENCOUNTER — Telehealth: Payer: Self-pay | Admitting: Cardiovascular Disease

## 2022-08-11 MED ORDER — CARVEDILOL 6.25 MG PO TABS: 6.2500 mg | ORAL_TABLET | Freq: Two times a day (BID) | ORAL | 0 refills | Status: DC

## 2022-08-11 NOTE — Telephone Encounter (Signed)
*  STAT* If patient is at the pharmacy, call can be transferred to refill team.   1. Which medications need to be refilled? (please list name of each medication and dose if known)  carvedilol (COREG) 6.25 MG tablet  2. Which pharmacy/location (including street and city if local pharmacy) is medication to be sent to? [id number] - [place], [place] - [address]  3. Do they need a 30 day or 90 day supply?  90  day supply  

## 2022-08-11 NOTE — Telephone Encounter (Signed)
carvedilol (COREG) 6.25 MG tablet 180 tablet 0 08/11/2022    Sig - Route: Take 1 tablet (6.25 mg total) by mouth 2 (two) times daily with a meal. - Oral   Sent to pharmacy as: carvedilol (COREG) 6.25 MG tablet   E-Prescribing Status: Receipt confirmed by pharmacy (08/11/2022  3:42 PM EDT)    Pharmacy  [id number] - [place], [place]

## 2022-09-03 ENCOUNTER — Other Ambulatory Visit: Payer: Self-pay | Admitting: Cardiovascular Disease

## 2022-10-09 ENCOUNTER — Encounter: Payer: Self-pay | Admitting: Internal Medicine

## 2022-10-09 ENCOUNTER — Ambulatory Visit: Payer: 59 | Attending: Internal Medicine | Admitting: Internal Medicine

## 2022-10-09 ENCOUNTER — Other Ambulatory Visit
Admission: RE | Admit: 2022-10-09 | Discharge: 2022-10-09 | Disposition: A | Discharge status: Home / Self Care | Payer: 59 | Attending: Internal Medicine | Admitting: Internal Medicine

## 2022-10-09 VITALS — BP 138/84 | HR 65 | Ht 70.0 in | Wt 172.2 lb

## 2022-10-09 DIAGNOSIS — I10 Essential (primary) hypertension: Secondary | ICD-10-CM | POA: Diagnosis not present

## 2022-10-09 DIAGNOSIS — Z79899 Other long term (current) drug therapy: Secondary | ICD-10-CM | POA: Diagnosis present

## 2022-10-09 DIAGNOSIS — E785 Hyperlipidemia, unspecified: Secondary | ICD-10-CM

## 2022-10-09 DIAGNOSIS — R6882 Decreased libido: Secondary | ICD-10-CM | POA: Diagnosis not present

## 2022-10-09 DIAGNOSIS — I25119 Atherosclerotic heart disease of native coronary artery with unspecified angina pectoris: Secondary | ICD-10-CM | POA: Diagnosis not present

## 2022-10-09 LAB — CBC
HCT: 39 % (ref 39.0–52.0)
Hemoglobin: 13 g/dL (ref 13.0–17.0)
MCH: 30.7 pg (ref 26.0–34.0)
MCHC: 33.3 g/dL (ref 30.0–36.0)
MCV: 92.2 fL (ref 80.0–100.0)
Platelets: 235 10*3/uL (ref 150–400)
RBC: 4.23 MIL/uL (ref 4.22–5.81)
RDW: 12.6 % (ref 11.5–15.5)
WBC: 5.3 10*3/uL (ref 4.0–10.5)
nRBC: 0 % (ref 0.0–0.2)

## 2022-10-09 LAB — LIPID PANEL
Cholesterol: 151 mg/dL (ref 0–200)
HDL: 60 mg/dL (ref >40)
LDL Cholesterol: 82 mg/dL (ref 0–99)
Total CHOL/HDL Ratio: 2.5 RATIO
Triglycerides: 47 mg/dL (ref <150)
VLDL: 9 mg/dL (ref 0–40)

## 2022-10-09 LAB — COMPREHENSIVE METABOLIC PANEL
ALT: 46 U/L — ABNORMAL HIGH (ref 0–44)
AST: 41 U/L (ref 15–41)
Albumin: 4.9 g/dL (ref 3.5–5.0)
Alkaline Phosphatase: 57 U/L (ref 38–126)
Anion gap: 7 (ref 5–15)
BUN: 29 mg/dL — ABNORMAL HIGH (ref 6–20)
CO2: 26 mmol/L (ref 22–32)
Calcium: 9.2 mg/dL (ref 8.9–10.3)
Chloride: 103 mmol/L (ref 98–111)
Creatinine, Ser: 0.92 mg/dL (ref 0.61–1.24)
GFR, Estimated: >60 mL/min (ref >60)
Glucose, Bld: 94 mg/dL (ref 70–99)
Potassium: 4.4 mmol/L (ref 3.5–5.1)
Sodium: 136 mmol/L (ref 135–145)
Total Bilirubin: 1.3 mg/dL — ABNORMAL HIGH (ref 0.3–1.2)
Total Protein: 8 g/dL (ref 6.5–8.1)

## 2022-10-09 MED ORDER — NITROGLYCERIN 0.4 MG SL SUBL: 0.4000 mg | SUBLINGUAL_TABLET | SUBLINGUAL | 3 refills | Status: DC | PRN

## 2022-10-09 NOTE — Progress Notes (Signed)
Follow-up Outpatient Visit Date: 10/09/2022  Primary Care Provider: Reubin Milan, MD [address] [place] [address]  Chief Complaint: Chest pain  HPI:  Nathan Gray is a 52 y.o. male with history of coronary artery disease with NSTEMI status post PCI to the RCA in 08/2017, hypertension, hyperlipidemia, tobacco use, and anxiety, who presents for follow-up of coronary artery disease.  He was last seen in 07/2021 by Dr. Mariah Milling, at which time he was feeling well.  Today, Mr. Pike reports that he has noted some sporadic chest pain over the last year.  He describes it as transient discomfort only lasting a few seconds at a time that he associates with stress.  On further questioning, there may also be an exertional component.  It feels different than his heart attack in 2019.  He has not needed any nitroglycerin.  During one of the episodes, he felt like his breath was taken away for a few seconds.  Last event happened about a month ago.  He notes that his home blood pressures are usually 160-170 systolic, though he questions the accuracy of his home blood pressure cuff.  He happily reports that he has not smoked in almost 2 years.  Mr. Reitenbach notes some decreased libido and wonders if his medications could be contributing to his symptoms.  --------------------------------------------------------------------------------------------------  Past Medical History:  Diagnosis Date   Anxiety    CAD (coronary artery disease)    a. 08/2017 NSTEMI/PCI: LM nl, LAD 30ost, 50m, D1 50ost, RI nl, LCX small/nl, RCA 70p/18m (3.0x28 [place] DES), EF 65%.   History of echocardiogram    a. 08/2017 Echo: EF 65-70%, no rwma, mildly dil LA.   Hyperlipidemia    Hypertension    Tobacco abuse    Past Surgical History:  Procedure Laterality Date   APPENDECTOMY     CORONARY STENT INTERVENTION N/A 08/19/2017   Procedure: CORONARY STENT INTERVENTION;  Surgeon: Yvonne Kendall, MD;  Location: [facility];  Service: Cardiovascular;  Laterality: N/A;   LEFT HEART CATH AND CORONARY ANGIOGRAPHY N/A 08/19/2017   Procedure: LEFT HEART CATH AND CORONARY ANGIOGRAPHY;  Surgeon: Yvonne Kendall, MD;  Location: [facility];  Service: Cardiovascular;  Laterality: N/A;     Current Meds  Medication Sig   aspirin EC 81 MG tablet Take 1 tablet (81 mg total) by mouth daily. Swallow whole.   atorvastatin (LIPITOR) 80 MG tablet TAKE 1 TABLET BY MOUTH EVERY DAY   carvedilol (COREG) 6.25 MG tablet Take 1 tablet (6.25 mg total) by mouth 2 (two) times daily with a meal.   nitroGLYCERIN (NITROSTAT) 0.4 MG SL tablet Place 1 tablet (0.4 mg total) under the tongue every 5 (five) minutes as needed for chest pain.   telmisartan (MICARDIS) 40 MG tablet TAKE 1 TABLET BY MOUTH EVERY DAY    Allergies: Amoxicillin and Penicillins  Social History   Tobacco Use   Smoking status: Every Day    Packs/day: 0.25    Years: 20.00    Additional pack years: 0.00    Total pack years: 5.00    Types: Cigarettes   Smokeless tobacco: Former   Tobacco comments:    09/30/17 down to cigarettes in last 24 hrs  Vaping Use   Vaping Use: Never used  Substance Use Topics   Alcohol use: Yes    Comment: 1-2 drinks per month   Drug use: Yes    Types: Marijuana    Comment: smoked mj for first  time in 20 yrs about 1 wk ago.    Family History  Problem Relation Age of Onset   Hypertension Mother    Diabetes Father    Heart attack Father        first MI in mid-12's   CAD Father        s/p CABG   Diabetes Brother    Cancer Paternal Grandfather     Review of Systems: A 12-system review of systems was performed and was negative except as noted in the HPI.  --------------------------------------------------------------------------------------------------  Physical Exam: BP 138/84 (BP Location: Left Arm, Patient Position: Sitting, Cuff Size: Normal)   Pulse 65   Ht 5\' 10"  (1.778 m)   Wt 172 lb 3.2 oz  (78.1 kg)   SpO2 98%   BMI 24.71 kg/m   General:  NAD. Neck: No JVD or HJR. Lungs: Clear to auscultation bilaterally without wheezes or crackles. Heart: Regular rate and rhythm without murmurs, rubs, or gallops. Abdomen: Soft, nontender, nondistended. Extremities: No lower extremity edema.  EKG: Normal sinus rhythm with sinus arrhythmia.  No significant abnormality.  Lab Results  Component Value Date   WBC 6.3 07/18/2020   HGB 13.1 07/18/2020   HCT 38.4 07/18/2020   MCV 92 07/18/2020   PLT 215 07/18/2020    Lab Results  Component Value Date   NA 138 07/18/2020   K 4.4 07/18/2020   CL 104 07/18/2020   CO2 20 07/18/2020   BUN 18 07/18/2020   CREATININE 0.95 07/18/2020   GLUCOSE 92 07/18/2020   ALT 30 08/21/2021    Lab Results  Component Value Date   CHOL 114 08/21/2021   HDL 45 08/21/2021   LDLCALC 58 08/21/2021   TRIG 44 08/21/2021   CHOLHDL 2.5 08/21/2021    --------------------------------------------------------------------------------------------------  ASSESSMENT AND PLAN: Coronary artery disease with atypical angina: Mr. Stegeman reports a few episodes of transient chest pain that seem to be associated with stress or exertion at times.  We have discussed options for ischemia evaluation and have agreed to perform an exercise myocardial perfusion stress test.  We will refill his nitroglycerin today and continue aspirin, carvedilol, and atorvastatin for secondary prevention and antianginal therapy.  We will check a CBC, CMP, and lipid panel today.  Hypertension: Blood pressure borderline elevated today.  Continue current doses of carvedilol and telmisartan for now.  Decreased libido: Could be due to a number of factors including medications and underlying cardiovascular disease.  We have agreed to defer medication changes pending aforementioned testing.  Hyperlipidemia: Continue atorvastatin 80 mg daily.  Check CMP and lipid panel today.  Follow-up: Return  to clinic in 1 month.  Yvonne Kendall, MD 10/09/2022 8:48 AM

## 2022-10-09 NOTE — Patient Instructions (Addendum)
Medication Instructions:  Your Physician recommend you continue on your current medication as directed.    *If you need a refill on your cardiac medications before your next appointment, please call your pharmacy*   Lab Work: Your provider would like for you to have following labs drawn: (CBC, CMP, Lipid).   Please go to the [facility] entrance and check in at the front desk.  You do not need an appointment.  They are open from 7am-6 pm.    If you have labs (blood work) drawn today and your tests are completely normal, you will receive your results only by: MyChart Message (if you have MyChart) OR A paper copy in the mail If you have any lab test that is abnormal or we need to change your treatment, we will call you to review the results.   Testing/Procedures: Your provider has ordered a Exercise Myoview Stress test. This will take place at [facility]. Please report to the [facility] medical mall entrance. The volunteers at the first desk will direct you where to go.  [facility] MYOVIEW  Your provider has ordered a Stress Test with nuclear imaging. The purpose of this test is to evaluate the blood supply to your heart muscle. This procedure is referred to as a "Non-Invasive Stress Test." This is because other than having an IV started in your vein, nothing is inserted or "invades" your body. Cardiac stress tests are done to find areas of poor blood flow to the heart by determining the extent of coronary artery disease (CAD). Some patients exercise on a treadmill, which naturally increases the blood flow to your heart, while others who are unable to walk on a treadmill due to physical limitations will have a pharmacologic/chemical stress agent called Lexiscan . This medicine will mimic walking on a treadmill by temporarily increasing your coronary blood flow.   Please note: these test may take anywhere between 2-4 hours to complete  How to prepare for your Myoview test:  Nothing to eat for 6 hours  prior to the test Hold Carvedilol morning of test  No caffeine for 24 hours prior to test No smoking 24 hours prior to test. Your medication may be taken with water.  If your doctor stopped a medication because of this test, do not take that medication. Ladies, please do not wear dresses.  Skirts or pants are appropriate. Please wear a short sleeve shirt. No perfume, cologne or lotion. Wear comfortable walking shoes. No heels!   PLEASE NOTIFY THE OFFICE AT LEAST 24 HOURS IN ADVANCE IF YOU ARE UNABLE TO KEEP YOUR APPOINTMENT.  [phone] AND  PLEASE NOTIFY NUCLEAR MEDICINE AT [facility] AT LEAST 24 HOURS IN ADVANCE IF YOU ARE UNABLE TO KEEP YOUR APPOINTMENT. [phone]    Follow-Up: At [facility], you and your health needs are our priority.  As part of our continuing mission to provide you with exceptional heart care, we have created designated Provider Care Teams.  These Care Teams include your primary Cardiologist (physician) and Advanced Practice Providers (APPs -  Physician Assistants and Nurse Practitioners) who all work together to provide you with the care you need, when you need it.  We recommend signing up for the patient portal called "MyChart".  Sign up information is provided on this After Visit Summary.  MyChart is used to connect with patients for Virtual Visits (Telemedicine).  Patients are able to view lab/test results, encounter notes, upcoming appointments, etc.  Non-urgent messages can be sent to your provider as  well.   To learn more about what you can do with MyChart, go to [email or web address].    Your next appointment:   1 month(s)  Provider:   You may see Yvonne Kendall, MD or one of the following Advanced Practice Providers on your designated Care Team:   Nicolasa Ducking, NP Eula Listen, PA-C Cadence Fransico Michael, PA-C Charlsie Quest, NP

## 2022-10-10 ENCOUNTER — Other Ambulatory Visit: Payer: Self-pay

## 2022-10-10 DIAGNOSIS — Z79899 Other long term (current) drug therapy: Secondary | ICD-10-CM

## 2022-10-16 ENCOUNTER — Encounter
Admission: RE | Disposition: A | Discharge status: Home / Self Care | Payer: Self-pay | Source: Ambulatory Visit | Attending: Internal Medicine

## 2022-10-16 ENCOUNTER — Other Ambulatory Visit: Payer: Self-pay

## 2022-10-16 ENCOUNTER — Encounter: Payer: Self-pay | Admitting: Internal Medicine

## 2022-10-16 ENCOUNTER — Ambulatory Visit (HOSPITAL_BASED_OUTPATIENT_CLINIC_OR_DEPARTMENT_OTHER)
Admission: RE | Admit: 2022-10-16 | Discharge: 2022-10-16 | Disposition: A | Discharge status: Home / Self Care | Payer: 59 | Source: Ambulatory Visit | Attending: Internal Medicine | Admitting: Internal Medicine

## 2022-10-16 ENCOUNTER — Ambulatory Visit
Admission: RE | Admit: 2022-10-16 | Discharge: 2022-10-16 | Disposition: A | Discharge status: Home / Self Care | Payer: 59 | Source: Ambulatory Visit | Attending: Internal Medicine | Admitting: Internal Medicine

## 2022-10-16 DIAGNOSIS — Z87891 Personal history of nicotine dependence: Secondary | ICD-10-CM | POA: Insufficient documentation

## 2022-10-16 DIAGNOSIS — R0609 Other forms of dyspnea: Secondary | ICD-10-CM | POA: Diagnosis not present

## 2022-10-16 DIAGNOSIS — I25119 Atherosclerotic heart disease of native coronary artery with unspecified angina pectoris: Secondary | ICD-10-CM

## 2022-10-16 DIAGNOSIS — F419 Anxiety disorder, unspecified: Secondary | ICD-10-CM | POA: Insufficient documentation

## 2022-10-16 DIAGNOSIS — R079 Chest pain, unspecified: Secondary | ICD-10-CM

## 2022-10-16 DIAGNOSIS — E785 Hyperlipidemia, unspecified: Secondary | ICD-10-CM | POA: Diagnosis not present

## 2022-10-16 DIAGNOSIS — I10 Essential (primary) hypertension: Secondary | ICD-10-CM | POA: Diagnosis not present

## 2022-10-16 DIAGNOSIS — R9430 Abnormal result of cardiovascular function study, unspecified: Secondary | ICD-10-CM

## 2022-10-16 DIAGNOSIS — I252 Old myocardial infarction: Secondary | ICD-10-CM | POA: Diagnosis not present

## 2022-10-16 DIAGNOSIS — I25118 Atherosclerotic heart disease of native coronary artery with other forms of angina pectoris: Secondary | ICD-10-CM | POA: Insufficient documentation

## 2022-10-16 DIAGNOSIS — R9439 Abnormal result of other cardiovascular function study: Secondary | ICD-10-CM

## 2022-10-16 HISTORY — PX: LEFT HEART CATH AND CORONARY ANGIOGRAPHY: CATH118249

## 2022-10-16 HISTORY — PX: CORONARY PRESSURE/FFR STUDY: CATH118243

## 2022-10-16 LAB — NM MYOCAR MULTI W/SPECT W/WALL MOTION / EF
Angina Index: 1
Duke Treadmill Score: -7
Estimated workload: 11.2
Exercise duration (min): 7 min
Exercise duration (sec): 30 s
LV dias vol: 82 mL (ref 62–150)
LV sys vol: 31 mL
MPHR: 171 {beats}/min
Nuc Stress EF: 62 %
Peak HR: 148 {beats}/min
Percent HR: 86 %
Rest HR: 51 {beats}/min
Rest Nuclear Isotope Dose: 10.9 mCi
SDS: 1
SRS: 2
SSS: 0
ST Depression (mm): 2 mm
Stress Nuclear Isotope Dose: 32.2 mCi
TID: 1.03

## 2022-10-16 SURGERY — LEFT HEART CATH AND CORONARY ANGIOGRAPHY
Anesthesia: Moderate Sedation

## 2022-10-16 MED ORDER — FENTANYL CITRATE (PF) 100 MCG/2ML IJ SOLN
INTRAMUSCULAR | Status: DC | PRN
  Administered 2022-10-16: 25 ug via INTRAVENOUS

## 2022-10-16 MED ORDER — LABETALOL HCL 5 MG/ML IV SOLN: 10.0000 mg | INTRAVENOUS | Status: DC | PRN

## 2022-10-16 MED ORDER — SODIUM CHLORIDE 0.9% FLUSH: 3.0000 mL | Freq: Two times a day (BID) | INTRAVENOUS | Status: DC

## 2022-10-16 MED ORDER — FENTANYL CITRATE (PF) 100 MCG/2ML IJ SOLN
INTRAMUSCULAR | Status: AC
  Filled 2022-10-16: qty 2

## 2022-10-16 MED ORDER — SODIUM CHLORIDE 0.9 % WEIGHT BASED INFUSION
3.0000 mL/kg/h | INTRAVENOUS | Status: AC
  Administered 2022-10-16: 3 mL/kg/h via INTRAVENOUS

## 2022-10-16 MED ORDER — HEPARIN SODIUM (PORCINE) 1000 UNIT/ML IJ SOLN
INTRAMUSCULAR | Status: DC | PRN
  Administered 2022-10-16 (×2): 4000 [IU] via INTRAVENOUS

## 2022-10-16 MED ORDER — ASPIRIN 81 MG PO CHEW: 81.0000 mg | CHEWABLE_TABLET | ORAL | Status: DC

## 2022-10-16 MED ORDER — TECHNETIUM TC 99M TETROFOSMIN IV KIT
32.1900 | PACK | Freq: Once | INTRAVENOUS | Status: AC | PRN
  Administered 2022-10-16: 32.19 via INTRAVENOUS

## 2022-10-16 MED ORDER — SODIUM CHLORIDE 0.9% FLUSH: 3.0000 mL | INTRAVENOUS | Status: DC | PRN

## 2022-10-16 MED ORDER — TECHNETIUM TC 99M TETROFOSMIN IV KIT
10.8700 | PACK | Freq: Once | INTRAVENOUS | Status: AC | PRN
  Administered 2022-10-16: 10.87 via INTRAVENOUS

## 2022-10-16 MED ORDER — SODIUM CHLORIDE 0.9 % IV SOLN: 250.0000 mL | INTRAVENOUS | Status: DC | PRN

## 2022-10-16 MED ORDER — ONDANSETRON HCL 4 MG/2ML IJ SOLN: 4.0000 mg | Freq: Four times a day (QID) | INTRAMUSCULAR | Status: DC | PRN

## 2022-10-16 MED ORDER — IOHEXOL 300 MG/ML  SOLN
INTRAMUSCULAR | Status: DC | PRN
  Administered 2022-10-16: 70 mL

## 2022-10-16 MED ORDER — HEPARIN (PORCINE) IN NACL 1000-0.9 UT/500ML-% IV SOLN
INTRAVENOUS | Status: AC
  Filled 2022-10-16: qty 1000

## 2022-10-16 MED ORDER — HEPARIN SODIUM (PORCINE) 1000 UNIT/ML IJ SOLN
INTRAMUSCULAR | Status: AC
  Filled 2022-10-16: qty 10

## 2022-10-16 MED ORDER — NITROGLYCERIN 1 MG/10 ML FOR IR/CATH LAB
INTRA_ARTERIAL | Status: DC | PRN
  Administered 2022-10-16: 200 ug via INTRACORONARY

## 2022-10-16 MED ORDER — SODIUM CHLORIDE 0.9 % WEIGHT BASED INFUSION: 1.0000 mL/kg/h | INTRAVENOUS | Status: DC

## 2022-10-16 MED ORDER — MIDAZOLAM HCL 2 MG/2ML IJ SOLN
INTRAMUSCULAR | Status: AC
  Filled 2022-10-16: qty 2

## 2022-10-16 MED ORDER — ACETAMINOPHEN 325 MG PO TABS: 650.0000 mg | ORAL_TABLET | ORAL | Status: DC | PRN

## 2022-10-16 MED ORDER — NITROGLYCERIN 1 MG/10 ML FOR IR/CATH LAB
INTRA_ARTERIAL | Status: AC
  Filled 2022-10-16: qty 10

## 2022-10-16 MED ORDER — MIDAZOLAM HCL 2 MG/2ML IJ SOLN
INTRAMUSCULAR | Status: DC | PRN
  Administered 2022-10-16: 1 mg via INTRAVENOUS

## 2022-10-16 MED ORDER — VERAPAMIL HCL 2.5 MG/ML IV SOLN
INTRAVENOUS | Status: DC | PRN
  Administered 2022-10-16 (×2): 2.5 mg via INTRA_ARTERIAL

## 2022-10-16 MED ORDER — VERAPAMIL HCL 2.5 MG/ML IV SOLN
INTRAVENOUS | Status: AC
  Filled 2022-10-16: qty 2

## 2022-10-16 MED ORDER — SODIUM CHLORIDE 0.9 % IV SOLN: INTRAVENOUS | Status: AC

## 2022-10-16 MED ORDER — HYDRALAZINE HCL 20 MG/ML IJ SOLN: 10.0000 mg | INTRAMUSCULAR | Status: DC | PRN

## 2022-10-16 SURGICAL SUPPLY — 13 items
CATH 5FR JL3.5 JR4 ANG PIG MP (CATHETERS) IMPLANT
CATH LAUNCHER 5F JR4 (CATHETERS) IMPLANT
DEVICE RAD TR BAND REGULAR (VASCULAR PRODUCTS) IMPLANT
DRAPE BRACHIAL (DRAPES) IMPLANT
GLIDESHEATH SLEND SS 6F .021 (SHEATH) IMPLANT
GUIDEWIRE INQWIRE 1.5J.035X260 (WIRE) IMPLANT
GUIDEWIRE PRESSURE X 175 (WIRE) IMPLANT
INQWIRE 1.5J .035X260CM (WIRE) ×2
KIT ENCORE 26 ADVANTAGE (KITS) IMPLANT
PACK CARDIAC CATH (CUSTOM PROCEDURE TRAY) ×3 IMPLANT
PROTECTION STATION PRESSURIZED (MISCELLANEOUS) ×2
SET ATX-X65L (MISCELLANEOUS) IMPLANT
STATION PROTECTION PRESSURIZED (MISCELLANEOUS) IMPLANT

## 2022-10-16 NOTE — Interval H&P Note (Signed)
History and Physical Interval Note:  10/16/2022 2:42 PM  STEPHANIE TOUHEY  has presented today for surgery, with the diagnosis of coronary artery disease with atypical angina and abnormal stress test.  The various methods of treatment have been discussed with the patient and family. After consideration of risks, benefits and other options for treatment, the patient has consented to  Procedure(s): LEFT HEART CATH AND CORONARY ANGIOGRAPHY (Left) as a surgical intervention.  The patient's history has been reviewed, patient examined, no change in status, stable for surgery.  I have reviewed the patient's chart and labs.  Questions were answered to the patient's satisfaction.    Cath Lab Visit (complete for each Cath Lab visit)  Clinical Evaluation Leading to the Procedure:   ACS: No.  Non-ACS:    Anginal Classification: CCS II  Anti-ischemic medical therapy: Minimal Therapy (1 class of medications)  Non-Invasive Test Results: Intermediate-risk stress test findings: cardiac mortality 1-3%/year  Prior CABG: No previous CABG  Maven Rosander

## 2022-10-16 NOTE — Progress Notes (Signed)
Dr. Okey Dupre at bedside to speak with pt., his wife, and pt. Parents. All parties involved discussed and verbalized understanding of conversation with MD.

## 2022-10-16 NOTE — H&P (Signed)
Cardiology Admission History and Physical   Patient ID: Nathan Gray MRN: [id number]; DOB: 12-16-1970   Admission date: 10/16/2022  PCP:  Reubin Milan, MD   [facility] Providers Cardiologist:  Yvonne Kendall, MD        Chief Complaint:  Chest pain and dyspnea on exertion  Patient Profile:   Nathan Gray is a 52 y.o. male with history of coronary artery disease with NSTEMI status post PCI to the RCA in 08/2017, hypertension, hyperlipidemia, tobacco use, and anxiety, who is being seen 10/16/2022 for the evaluation of chest pain, dyspnea on exertion, and abnormal stress test.  History of Present Illness:   Mr. Aase was seren in the office last week, at which time he complained of sporadic chest pain over the last year.  He associated the pain primarily with stress though he also questioned an exertional component that had occurred more frequently in the last few months.  He also noted some exertional dyspnea.  He presented today for exercise myocardial perfusion stress test, which showed normal baseline EKG.  However, he developed 1.5-2 mm horizontal/downsloping ST-depressions in the inferior leads.  He reported exertional dyspnea and developed mild left upper chest pain at peak stress as the test was terminated.  The pain lasted 5-10 minutes and resolved spontaneously.  He was monitored on EKG and had improvement but not complete resolution of the ST segment changes.  Past Medical History:  Diagnosis Date   Anxiety    CAD (coronary artery disease)    a. 08/2017 NSTEMI/PCI: LM nl, LAD 30ost, 81m, D1 50ost, RI nl, LCX small/nl, RCA 70p/27m (3.0x28 [place] DES), EF 65%.   History of echocardiogram    a. 08/2017 Echo: EF 65-70%, no rwma, mildly dil LA.   Hyperlipidemia    Hypertension    Tobacco abuse     Past Surgical History:  Procedure Laterality Date   APPENDECTOMY     CORONARY STENT INTERVENTION N/A 08/19/2017   Procedure: CORONARY STENT INTERVENTION;   Surgeon: Yvonne Kendall, MD;  Location: [facility] INVASIVE CV LAB;  Service: Cardiovascular;  Laterality: N/A;   LEFT HEART CATH AND CORONARY ANGIOGRAPHY N/A 08/19/2017   Procedure: LEFT HEART CATH AND CORONARY ANGIOGRAPHY;  Surgeon: Yvonne Kendall, MD;  Location: [facility] INVASIVE CV LAB;  Service: Cardiovascular;  Laterality: N/A;     Medications Prior to Admission: Prior to Admission medications   Medication Sig Start Date Kory Rains Date Taking? Authorizing Provider  aspirin EC 81 MG tablet Take 1 tablet (81 mg total) by mouth daily. Swallow whole. 01/13/20  Yes Padme Arriaga, Cristal Deer, MD  atorvastatin (LIPITOR) 80 MG tablet TAKE 1 TABLET BY MOUTH EVERY DAY 09/03/22  Yes Gollan, Tollie Pizza, MD  carvedilol (COREG) 6.25 MG tablet Take 1 tablet (6.25 mg total) by mouth 2 (two) times daily with a meal. 08/11/22  Yes Cyndra Feinberg, Cristal Deer, MD  telmisartan (MICARDIS) 40 MG tablet TAKE 1 TABLET BY MOUTH EVERY DAY 09/03/22  Yes Gollan, Tollie Pizza, MD  nitroGLYCERIN (NITROSTAT) 0.4 MG SL tablet Place 1 tablet (0.4 mg total) under the tongue every 5 (five) minutes as needed for chest pain. Patient not taking: Reported on 10/16/2022 10/09/22   Yvonne Kendall, MD     Allergies:    Allergies  Allergen Reactions   Amoxicillin    Penicillins     Has patient had a PCN reaction causing immediate rash, facial/tongue/throat swelling, SOB or lightheadedness with hypotension: Unknown Has patient had a PCN reaction causing severe rash involving mucus membranes  or skin necrosis: Unknown Has patient had a PCN reaction that required hospitalization: Unknown Has patient had a PCN reaction occurring within the last 10 years: Unknown If all of the above answers are "NO", then may proceed with Cephalosporin use.    Social History:   Social History   Socioeconomic History   Marital status: Married    Spouse name: Ninetta Lights   Number of children: 1   Years of education: Not on file   Highest education level: Not on file  Occupational History    Occupation: Probation officer  Tobacco Use   Smoking status: Former    Packs/day: 0.25    Years: 20.00    Additional pack years: 0.00    Total pack years: 5.00    Types: Cigarettes    Quit date: 2022    Years since quitting: 2.4   Smokeless tobacco: Former   Tobacco comments:    09/30/17 down to cigarettes in last 24 hrs  Vaping Use   Vaping Use: Never used  Substance and Sexual Activity   Alcohol use: Yes    Comment: 1-2 drinks per month   Drug use: Yes    Types: Marijuana    Comment: once in the past year   Sexual activity: Not on file  Other Topics Concern   Not on file  Social History Narrative   Lives at home with wife and stepdaughter    Social Determinants of Health   Financial Resource Strain: Not on file  Food Insecurity: Not on file  Transportation Needs: Not on file  Physical Activity: Not on file  Stress: Not on file  Social Connections: Not on file  Intimate Partner Violence: Not on file    Family History:  The patient's family history includes CAD in his father; Cancer in his paternal grandfather; Diabetes in his brother and father; Heart attack in his father; Hypertension in his mother.    ROS:  Please see the history of present illness. All other ROS reviewed and negative.     Physical Exam/Data:   Vitals:   10/16/22 1051 10/16/22 1100  BP:  (!) 143/98  Pulse:  66  Resp:  (!) 22  Temp: 97.7 F (36.5 C)   TempSrc: Oral   SpO2:  99%   No intake or output data in the 24 hours ending 10/16/22 1348    10/09/2022    8:35 AM 07/25/2021    4:21 PM 07/18/2020    9:09 AM  Last 3 Weights  Weight (lbs) 172 lb 3.2 oz 155 lb 162 lb 4 oz  Weight (kg) 78.109 kg 70.308 kg 73.596 kg     There is no height or weight on file to calculate BMI.  General:  Well nourished, well developed, in no acute distress. HEENT: normal Neck: no JVD Vascular: No carotid bruits; Distal pulses 2+ bilaterally   Cardiac:  normal S1, S2; RRR; no murmurs, rubs, or gallops Lungs:   clear to auscultation bilaterally, no wheezing, rhonchi or rales  Abd: soft, nontender, no hepatomegaly  Ext: no lower extremity. edema Psych:  Normal affect    EKG:  The ECG that was done at start of stress test today was personally reviewed and demonstrates sinus bradycardia; no significant abnormalities noted.  Relevant CV Studies: See MPI below.  TTE (08/20/2017): - Left ventricle: The cavity size was normal. There was moderate    concentric hypertrophy. Systolic function was vigorous. The    estimated ejection fraction was in the range of 65%  to 70%. Wall    motion was normal; there were no regional wall motion    abnormalities. Left ventricular diastolic function parameters    were normal.  - Left atrium: The atrium was mildly dilated.   Laboratory Data:  Lab Results  Component Value Date   WBC 5.3 10/09/2022   HGB 13.0 10/09/2022   HCT 39.0 10/09/2022   MCV 92.2 10/09/2022   PLT 235 10/09/2022   Last metabolic panel Lab Results  Component Value Date   GLUCOSE 94 10/09/2022   NA 136 10/09/2022   K 4.4 10/09/2022   CL 103 10/09/2022   CO2 26 10/09/2022   BUN 29 (H) 10/09/2022   CREATININE 0.92 10/09/2022   GFRNONAA >60 10/09/2022   CALCIUM 9.2 10/09/2022   PROT 8.0 10/09/2022   ALBUMIN 4.9 10/09/2022   LABGLOB 2.7 07/18/2020   AGRATIO 1.6 07/18/2020   BILITOT 1.3 (H) 10/09/2022   ALKPHOS 57 10/09/2022   AST 41 10/09/2022   ALT 46 (H) 10/09/2022   ANIONGAP 7 10/09/2022    Radiology/Studies:  NM Myocar Multi W/Spect W/Wall Motion / EF  Result Date: 10/16/2022   Abnormal exercise myocardial perfusion stress test.   There is a small in size, mild in severity, fixed apical anterior defect most consistent with scar but cannot rule out artifact.   No significant ischemia is identified on the perfusion images.   Left ventricular systolic function is normal (LVEF 55-65).   Coronary artery calcification are noted on the attenuation correction CT as well as an RCA  stent.   ECG portion of the study showed up to 2 mm horizontal/downsloping ST depression in the inferior leads.  The patient complained of exertional dyspnea during stress and developed atypical chest pain at peak stress that resolved spontaneously after 5-10 minutes of recovery.  Nonspecific ST changes persisted beyond 20 minutes into recovery.   This is an intermediate risk study based on perfusion images and ECG findings.   The study is intermediate risk.     Assessment and Plan:   Coronary artery disease with atypical angina and abnormal stress test: Overall, chest pain had been fairly infrequent and atypical, though Mr. Annis reported an intermittent exertional component.  His wife comments today that Mr. Franzel has been experiencing more exertional dyspnea over the last few months.  Both he and she are concerned about progression of moderate LAD disease and/or restenosis of RCA stent.  Stress test today was intermediate risk due to exertional symptoms and EKG abnormalities, though perfusion images were reassuring with only a small fixed apical anterior defect that could represent subtle scar or artifact.  We discussed definite evaluation of Mr. Cimmino' coronaries with catheterization versus medical therapy and close follow-up; Mr. Dehlinger wishes to proceed with cath.  Given today's stress EKG findings and symptoms, we will move forward with urgent catheterization today.  If cath is reassuring, we will arrange for outpatient echocardiogram to assess for potential structural abnormalities contributing to his symptoms.  Shared Decision Making/Informed Consent The risks [stroke (1 in 1000), death (1 in 1000), kidney failure [usually temporary] (1 in 500), bleeding (1 in 200), allergic reaction [possibly serious] (1 in 200)], benefits (diagnostic support and management of coronary artery disease) and alternatives of a cardiac catheterization were discussed in detail with Mr. Henigan and he is willing  to proceed.  For questions or updates, please contact [facility] Please consult [email or web address] for contact info under [facility].   Signed, Yvonne Kendall, MD  10/16/2022  1:48 PM

## 2022-10-16 NOTE — Brief Op Note (Signed)
BRIEF CARDIAC CATHETERIZATION NOTE  10/16/2022  3:58 PM  PATIENT:  Nathan Gray  52 y.o. male  PRE-OPERATIVE DIAGNOSIS:  Coronary artery disease with atypical angina and abnormal stress test  POST-OPERATIVE DIAGNOSIS:  Same  PROCEDURE:  Procedure(s): LEFT HEART CATH AND CORONARY ANGIOGRAPHY (Left) CORONARY PRESSURE/FFR STUDY (N/A)  SURGEON:  Surgeon(s) and Role:    * Layal Javid, MD - Primary  FINDINGS: Non-obstructive CAD with 50-60% proximal LAD stenosis that is not hemodynamically significant (RFR = 0.94).  Mild-moderate left coronary artery disease of up to 50% noted, as well as myocardial bridge in the mid LAD. Normal LVEF with mildly elevated filling pressure (LVEDP 15-20 mmHg).  RECOMMENDATIONS: Continue carvedilol with titration as needed for resting HR near 60 bpm. Aggressive secondary prevention of CAD (statin currently on hold due to mild transaminitis with repeat labs in ~3 weeks).  Yvonne Kendall, MD [facility]

## 2022-10-17 ENCOUNTER — Telehealth: Payer: Self-pay

## 2022-10-17 DIAGNOSIS — R079 Chest pain, unspecified: Secondary | ICD-10-CM

## 2022-10-17 DIAGNOSIS — R0609 Other forms of dyspnea: Secondary | ICD-10-CM

## 2022-10-17 NOTE — Telephone Encounter (Signed)
Per Dr. Okey Dupre pt needs and ECHO and follow up appointment in 2-3 week follow up.  ECHO order placed and message sent to scheduling to arrange appointment date and time.

## 2022-10-20 ENCOUNTER — Encounter: Payer: Self-pay | Admitting: Internal Medicine

## 2022-10-20 LAB — POCT ACTIVATED CLOTTING TIME: Activated Clotting Time: 220 seconds

## 2022-10-22 ENCOUNTER — Ambulatory Visit (HOSPITAL_COMMUNITY): Payer: 59 | Attending: Cardiology

## 2022-10-22 DIAGNOSIS — R0609 Other forms of dyspnea: Secondary | ICD-10-CM | POA: Diagnosis present

## 2022-10-22 DIAGNOSIS — R079 Chest pain, unspecified: Secondary | ICD-10-CM

## 2022-10-22 LAB — ECHOCARDIOGRAM COMPLETE
Area-P 1/2: 3.99 cm2
Calc EF: 69.4 %
S' Lateral: 2.45 cm
Single Plane A2C EF: 67.3 %
Single Plane A4C EF: 67.9 %

## 2022-11-07 ENCOUNTER — Other Ambulatory Visit: Payer: Self-pay | Admitting: Internal Medicine

## 2022-11-11 ENCOUNTER — Other Ambulatory Visit
Admission: RE | Admit: 2022-11-11 | Discharge: 2022-11-11 | Disposition: A | Discharge status: Home / Self Care | Payer: 59 | Attending: Internal Medicine | Admitting: Internal Medicine

## 2022-11-11 DIAGNOSIS — Z79899 Other long term (current) drug therapy: Secondary | ICD-10-CM | POA: Diagnosis present

## 2022-11-11 LAB — HEPATIC FUNCTION PANEL
ALT: 25 U/L (ref 0–44)
AST: 23 U/L (ref 15–41)
Albumin: 4.2 g/dL (ref 3.5–5.0)
Alkaline Phosphatase: 41 U/L (ref 38–126)
Bilirubin, Direct: <0.1 mg/dL (ref 0.0–0.2)
Total Bilirubin: 0.7 mg/dL (ref 0.3–1.2)
Total Protein: 7.3 g/dL (ref 6.5–8.1)

## 2022-11-13 ENCOUNTER — Other Ambulatory Visit: Payer: Self-pay

## 2022-11-13 DIAGNOSIS — Z79899 Other long term (current) drug therapy: Secondary | ICD-10-CM

## 2022-11-13 MED ORDER — ROSUVASTATIN CALCIUM 20 MG PO TABS: 20.0000 mg | ORAL_TABLET | Freq: Every day | ORAL | 3 refills | Status: DC

## 2022-12-03 ENCOUNTER — Other Ambulatory Visit: Payer: Self-pay | Admitting: Cardiovascular Disease

## 2022-12-05 ENCOUNTER — Ambulatory Visit: Payer: 59 | Attending: Internal Medicine | Admitting: Internal Medicine

## 2022-12-05 ENCOUNTER — Encounter: Payer: Self-pay | Admitting: Internal Medicine

## 2022-12-05 VITALS — BP 126/78 | HR 50 | Ht 70.0 in | Wt 178.0 lb

## 2022-12-05 DIAGNOSIS — I25118 Atherosclerotic heart disease of native coronary artery with other forms of angina pectoris: Secondary | ICD-10-CM

## 2022-12-05 DIAGNOSIS — I10 Essential (primary) hypertension: Secondary | ICD-10-CM

## 2022-12-05 DIAGNOSIS — E785 Hyperlipidemia, unspecified: Secondary | ICD-10-CM

## 2022-12-05 DIAGNOSIS — N529 Male erectile dysfunction, unspecified: Secondary | ICD-10-CM | POA: Diagnosis not present

## 2022-12-05 MED ORDER — SILDENAFIL CITRATE 50 MG PO TABS
50.0000 mg | ORAL_TABLET | Freq: Every day | ORAL | 1 refills | Status: DC | PRN
Start: 2022-12-05 — End: 2023-07-22

## 2022-12-05 NOTE — Progress Notes (Signed)
Cardiology Office Note:  .   Date:  12/05/2022  ID:  [name], DOB 03-13-1971, MRN [id number] PCP: Nathan Milan, MD  [facility] Providers Cardiologist:  Yvonne Kendall, MD     History of Present Illness: .   Nathan Gray is a 52 y.o. male with history of coronary artery disease with NSTEMI status post PCI to the RCA in 08/2017, hypertension, hyperlipidemia, tobacco use, and anxiety, presenting for follow-up of coronary artery disease.  I last saw him in late May, at which time he reported sporadic chest pains over the last year.  We agreed to perform an exercise MPI, which did not show any significant perfusion defects but was notable for exercise-induced chest pain and 2 mm of horizontal/downsloping ST depressions in the inferior leads.  We proceeded with urgent catheterization that day, which showed widely patent RCA stent with 50-60% stenosis proximal to the stent that was not significant by RFR (0.94).  Myocardial bridging of the mid LAD was noted.  He was continued on carvedilol for heart rate control and antianginal therapy.  Today, Nathan Gray reports that he has been feeling fairly well.  He thinks he may have had a single episode of transient chest discomfort between his catheterization in May and his echo in June.  He has not had any discomfort since then over the last 6 weeks.  He is exercising regularly without difficulty.  He also denies shortness of breath, palpitations, and lightheadedness.  We previously switched him from atorvastatin to rosuvastatin due to some arthralgias and abnormal LFTs.  He has been on rosuvastatin for almost all months and has not had any joint pains or abdominal discomfort.  ROS: [name] notes difficulty having an erection that has gradually worsened over the last few years.  He wonders if this could be due to any of his medications, specifically carvedilol.  Studies Reviewed: Marland Kitchen        TTE (10/22/2022):  1. Left ventricular  ejection fraction, by estimation, is 60 to 65%. The  left ventricle has normal function. The left ventricle has no regional  wall motion abnormalities. Left ventricular diastolic parameters are  indeterminate. The average left  ventricular global longitudinal strain is -22.7 %. The global longitudinal  strain is normal.   2. Right ventricular systolic function is normal. The right ventricular  size is normal. There is normal pulmonary artery systolic pressure.   3. The mitral valve is normal in structure. Mild to moderate mitral valve  regurgitation. No evidence of mitral stenosis. There is mild prolapse of  posterior leaflet of the mitral valve.   4. The aortic valve is normal in structure. Aortic valve regurgitation is  not visualized. No aortic stenosis is present.   5. The inferior vena cava is normal in size with greater than 50%  respiratory variability, suggesting right atrial pressure of 3 mmHg.   LHC (10/16/2022): Non-obstructive coronary artery disease, including 50-60% proximal RCA stenosis that is not hemodynamically significant (RFR 0.94) and mild-moderate ostial LAD and D1 disease. Myocardial bridging of mid LAD. Widely patent proximal/mid RCA stent. Normal left ventricular systolic function (LVEF 55-65%). Mildly elevated left ventricular filling pressure (LVEDP 15-20 mmHg). I suspect exertional symptoms and EKG changes observed on stress test today are due to LVH/abnormal repolarization +/- mid LAD myocardial bridge.  MPI (10/16/2022):   Abnormal exercise myocardial perfusion stress test.   There is a small in size, mild in severity, fixed apical anterior defect most consistent with scar  but cannot rule out artifact.   No significant ischemia is identified on the perfusion images.   Left ventricular systolic function is normal (LVEF 55-65).   Coronary artery calcification are noted on the attenuation correction CT as well as an RCA stent.   ECG portion of the study showed  up to 2 mm horizontal/downsloping ST depression in the inferior leads.  The patient complained of exertional dyspnea during stress and developed atypical chest pain at peak stress that resolved spontaneously after 5-10 minutes of recovery.  Nonspecific ST changes persisted beyond 20 minutes into recovery.   This is an intermediate risk study based on perfusion images and ECG findings.   The study is intermediate risk.  Risk Assessment/Calculations:             Physical Exam:   VS:  BP 126/78 (BP Location: Left Arm, Patient Position: Sitting, Cuff Size: Normal)   Pulse (!) 50   Ht 5\' 10"  (1.778 m)   Wt 178 lb (80.7 kg)   SpO2 99%   BMI 25.54 kg/m    Wt Readings from Last 3 Encounters:  12/05/22 178 lb (80.7 kg)  10/09/22 172 lb 3.2 oz (78.1 kg)  07/25/21 155 lb (70.3 kg)    General:  NAD. Neck: No JVD or HJR. Lungs: Clear to auscultation bilaterally without wheezes or crackles. Heart: Bradycardic but regular without murmurs, rubs, or gallops. Abdomen: Soft, nontender, nondistended. Extremities: No lower extremity edema.  ASSESSMENT AND PLAN: .    Coronary artery disease with stable angina: Nathan Gray has done well since his catheterization in late May, which revealed nonobstructive proximal RCA disease, widely patent mid RCA stent, and mid LAD myocardial bridging.  Though his resting heart rate is somewhat low today, he is asymptomatic.  In the setting of his erectile dysfunction we discussed de-escalation/discontinuation of carvedilol but will continue this for now given its antianginal effects in the setting of his myocardial bridge.  Continue rosuvastatin and aspirin for secondary prevention with plans to recheck a lipid panel and LFTs in early August.  Hyperlipidemia: Nathan Gray is tolerating rosuvastatin well without arthralgias that he experienced with atorvastatin.  We will plan to recheck his lipids as well as liver function in early August at which time he will of  completed at least 6 weeks of therapy.  Hypertension: Blood pressure well-controlled today.  Continue current doses of telmisartan and carvedilol.  Erectile dysfunction: This has gradually been progressing for the last few years.  We discussed potential causes of his erectile dysfunction including cardiovascular disease and medication side effects.  Risks and benefits of decreasing/stopping carvedilol for weight, and weight agreed to defer this given its antianginal benefits.  Instead, we have agreed to a trial of sildenafil 50 mg daily as needed for erectile dysfunction.  I counseled Mr. Cipollone on potential side effects including lightheadedness and priapism.  I advised him to avoid taking nitroglycerin within 24 hours of having used sildenafil.  If erectile dysfunction persists, we will need to readdress discontinuation of metoprolol versus urology consultation.    Dispo: Return to clinic in 3 months.  Signed, Yvonne Kendall, MD

## 2022-12-05 NOTE — Patient Instructions (Signed)
Medication Instructions:  Your physician recommends the following medication changes.  START TAKING: Sildenafil 50 mg daily as needed (Please do not take Nitroglycerin within 24 hours of this medication)   *If you need a refill on your cardiac medications before your next appointment, please call your pharmacy*   Lab Work: Your provider would like for you to return 1st week of August to have the following labs drawn: (lipid, ALT).   Please go to the [facility] entrance and check in at the front desk.  You do not need an appointment.  They are open from 7am-6 pm.  You will need to be fasting.    Testing/Procedures: None ordered today   Follow-Up: At [facility], you and your health needs are our priority.  As part of our continuing mission to provide you with exceptional heart care, we have created designated Provider Care Teams.  These Care Teams include your primary Cardiologist (physician) and Advanced Practice Providers (APPs -  Physician Assistants and Nurse Practitioners) who all work together to provide you with the care you need, when you need it.  We recommend signing up for the patient portal called "MyChart".  Sign up information is provided on this After Visit Summary.  MyChart is used to connect with patients for Virtual Visits (Telemedicine).  Patients are able to view lab/test results, encounter notes, upcoming appointments, etc.  Non-urgent messages can be sent to your provider as well.   To learn more about what you can do with MyChart, go to [email or web address].    Your next appointment:   3 month(s)  Provider:   You may see Yvonne Kendall, MD or one of the following Advanced Practice Providers on your designated Care Team:   Nicolasa Ducking, NP Eula Listen, PA-C Cadence Fransico Michael, PA-C Charlsie Quest, NP

## 2022-12-23 ENCOUNTER — Other Ambulatory Visit
Admission: AC | Admit: 2022-12-23 | Discharge: 2022-12-23 | Disposition: A | Discharge status: Home / Self Care | Payer: 59 | Attending: Internal Medicine | Admitting: Internal Medicine

## 2022-12-23 DIAGNOSIS — Z79899 Other long term (current) drug therapy: Secondary | ICD-10-CM | POA: Diagnosis not present

## 2022-12-23 LAB — LIPID PANEL
Cholesterol: 138 mg/dL (ref 0–200)
HDL: 52 mg/dL (ref >40)
LDL Cholesterol: 72 mg/dL (ref 0–99)
Total CHOL/HDL Ratio: 2.7 RATIO
Triglycerides: 71 mg/dL (ref <150)
VLDL: 14 mg/dL (ref 0–40)

## 2022-12-23 LAB — ALT: ALT: 36 U/L (ref 0–44)

## 2022-12-24 ENCOUNTER — Other Ambulatory Visit: Payer: Self-pay

## 2022-12-24 DIAGNOSIS — Z79899 Other long term (current) drug therapy: Secondary | ICD-10-CM

## 2022-12-24 MED ORDER — ROSUVASTATIN CALCIUM 40 MG PO TABS: 40.0000 mg | ORAL_TABLET | Freq: Every day | ORAL | 3 refills | Status: DC

## 2023-02-06 ENCOUNTER — Other Ambulatory Visit: Payer: Self-pay | Admitting: Internal Medicine

## 2023-03-01 ENCOUNTER — Other Ambulatory Visit: Payer: Self-pay | Admitting: Internal Medicine

## 2023-03-12 ENCOUNTER — Ambulatory Visit: Payer: 59 | Attending: Internal Medicine | Admitting: Internal Medicine

## 2023-03-12 ENCOUNTER — Encounter: Payer: Self-pay | Admitting: Internal Medicine

## 2023-03-12 VITALS — BP 148/96 | HR 52 | Ht 70.5 in | Wt 188.4 lb

## 2023-03-12 DIAGNOSIS — I2510 Atherosclerotic heart disease of native coronary artery without angina pectoris: Secondary | ICD-10-CM

## 2023-03-12 DIAGNOSIS — Z79899 Other long term (current) drug therapy: Secondary | ICD-10-CM

## 2023-03-12 DIAGNOSIS — I25118 Atherosclerotic heart disease of native coronary artery with other forms of angina pectoris: Secondary | ICD-10-CM | POA: Diagnosis not present

## 2023-03-12 DIAGNOSIS — I10 Essential (primary) hypertension: Secondary | ICD-10-CM | POA: Diagnosis not present

## 2023-03-12 NOTE — Progress Notes (Signed)
Cardiology Office Note:  .   Date:  03/13/2023  ID:  Nathan Gray, DOB [birth year], MRN [id number] PCP: Reubin Milan, MD  [facility] Providers Cardiologist:  Yvonne Kendall, MD     History of Present Illness: .   Nathan Gray is a 52 y.o. male with history of coronary artery disease with NSTEMI status post PCI to the RCA in 08/2017 and myocardial bridging of the mid LAD identified on repeat catheterization in 2024, hypertension, hyperlipidemia, tobacco use, and anxiety, who presents for follow-up of coronary artery disease.  I last saw him in July, at which time he was feeling fairly well with only a single transient episode of chest discomfort.  He was exercising regularly without any difficulty.  His only other complaint was of progressive erectile dysfunction, for which we agreed to initiate sildenafil.  Today, Nathan Gray reports that he has been doing fairly well.  He continues to exercise fairly regularly without any limitations, though he had been on a bit of a hiatus over the last month while his wife was out of the country.  He notes occasional transient episodes of chest discomfort that only last for a few seconds and are sharp in nature.  They sometimes take his breath away.  He seems to notice them the most if he is exercising and then stops abruptly.  If he cools down gradually, he does not have any symptoms.  He denies palpitations, lightheadedness, and edema.  He only used sildenafil once since our last visit and tolerated this well with a reasonably good response.  His wife intermittently checks his blood pressure at home; Mr. Torbeck notes that it is typically a little bit lower than what we measured in the office today.  ROS: See HPI  Studies Reviewed: Marland Kitchen   EKG Interpretation Date/Time:  Thursday March 12 2023 09:17:05 EDT Ventricular Rate:  52 PR Interval:  208 QRS Duration:  104 QT Interval:  412 QTC Calculation: 383 R Axis:   64  Text  Interpretation: Sinus bradycardia Otherwise normal ECG When compared with ECG of 05-Dec-2022 09:45, No significant change was found Confirmed by Zephyr Ridley [phone]) on 03/12/2023 9:35:36 AM    TTE (10/22/2022):  1. Left ventricular ejection fraction, by estimation, is 60 to 65%. The  left ventricle has normal function. The left ventricle has no regional  wall motion abnormalities. Left ventricular diastolic parameters are  indeterminate. The average left  ventricular global longitudinal strain is -22.7 %. The global longitudinal  strain is normal.   2. Right ventricular systolic function is normal. The right ventricular  size is normal. There is normal pulmonary artery systolic pressure.   3. The mitral valve is normal in structure. Mild to moderate mitral valve  regurgitation. No evidence of mitral stenosis. There is mild prolapse of  posterior leaflet of the mitral valve.   4. The aortic valve is normal in structure. Aortic valve regurgitation is  not visualized. No aortic stenosis is present.   5. The inferior vena cava is normal in size with greater than 50%  respiratory variability, suggesting right atrial pressure of 3 mmHg.    LHC (10/16/2022): Non-obstructive coronary artery disease, including 50-60% proximal RCA stenosis that is not hemodynamically significant (RFR 0.94) and mild-moderate ostial LAD and D1 disease. Myocardial bridging of mid LAD. Widely patent proximal/mid RCA stent. Normal left ventricular systolic function (LVEF 55-65%). Mildly elevated left ventricular filling pressure (LVEDP 15-20 mmHg). I suspect exertional symptoms and EKG changes observed  on stress test today are due to LVH/abnormal repolarization +/- mid LAD myocardial bridge.   MPI (10/16/2022):   Abnormal exercise myocardial perfusion stress test.   There is a small in size, mild in severity, fixed apical anterior defect most consistent with scar but cannot rule out artifact.   No significant  ischemia is identified on the perfusion images.   Left ventricular systolic function is normal (LVEF 55-65).   Coronary artery calcification are noted on the attenuation correction CT as well as an RCA stent.   ECG portion of the study showed up to 2 mm horizontal/downsloping ST depression in the inferior leads.  The patient complained of exertional dyspnea during stress and developed atypical chest pain at peak stress that resolved spontaneously after 5-10 minutes of recovery.  Nonspecific ST changes persisted beyond 20 minutes into recovery.   This is an intermediate risk study based on perfusion images and ECG findings.   The study is intermediate risk.  Risk Assessment/Calculations:     HYPERTENSION CONTROL Vitals:   03/12/23 0908 03/12/23 0938  BP: (!) 120/90 (!) 148/96    The patient's blood pressure is elevated above target today.  In order to address the patient's elevated BP: Blood pressure will be monitored at home to determine if medication changes need to be made.          Physical Exam:   VS:  BP (!) 148/96 (BP Location: Left Arm)   Pulse (!) 52   Ht 5' 10.5" (1.791 m)   Wt 188 lb 6 oz (85.4 kg)   SpO2 98%   BMI 26.65 kg/m    Wt Readings from Last 3 Encounters:  03/12/23 188 lb 6 oz (85.4 kg)  12/05/22 178 lb (80.7 kg)  10/09/22 172 lb 3.2 oz (78.1 kg)    General:  NAD. Neck: No JVD or HJR. Lungs: Clear to auscultation bilaterally without wheezes or crackles. Heart: Bradycardic but regular without murmurs, rubs, or gallops. Abdomen: Soft, nontender, nondistended. Extremities: No lower extremity edema.  ASSESSMENT AND PLAN: .    Coronary artery disease with stable angina: Nathan Gray is doing well with only rare sporadic chest discomfort.  The quality of the pain remains atypical.  We reviewed his coronary angiogram from May again today, which showed moderate proximal RCA disease that was not hemodynamically significant as well as a myocardial bridge in the mid  LAD.  I encouraged him to continue exercising as tolerated.  We will continue carvedilol for heart rate control and antianginal therapy.  Continue aspirin and rosuvastatin for secondary prevention as well.  Hypertension: Blood pressure mildly elevated today though typically better at home.  I have asked Mr. Scholler to monitor his blood pressure a little bit more regularly at home over the next month and to forward his readings to [place] through MyChart.  If his blood pressure remains [place] near or above 140/90, we will need to consider escalation of telmisartan.    Dispo: Return to clinic in 6 months.  Signed, Yvonne Kendall, MD

## 2023-03-12 NOTE — Patient Instructions (Signed)
Medication Instructions:  Your physician recommends that you continue on your current medications as directed. Please refer to the Current Medication list given to you today.    *If you need a refill on your cardiac medications before your next appointment, please call your pharmacy*   Lab Work: No labs ordered today    Testing/Procedures: No test ordered today    Follow-Up: At [facility], you and your health needs are our priority.  As part of our continuing mission to provide you with exceptional heart care, we have created designated Provider Care Teams.  These Care Teams include your primary Cardiologist (physician) and Advanced Practice Providers (APPs -  Physician Assistants and Nurse Practitioners) who all work together to provide you with the care you need, when you need it.  We recommend signing up for the patient portal called "MyChart".  Sign up information is provided on this After Visit Summary.  MyChart is used to connect with patients for Virtual Visits (Telemedicine).  Patients are able to view lab/test results, encounter notes, upcoming appointments, etc.  Non-urgent messages can be sent to your provider as well.   To learn more about what you can do with MyChart, go to [email or web address].    Your next appointment:   6 month(s)  Provider:   You may see Yvonne Kendall, MD or one of the following Advanced Practice Providers on your designated Care Team:   Nicolasa Ducking, NP Eula Listen, PA-C Cadence Fransico Michael, PA-C Charlsie Quest, NP    Dr. Okey Dupre recommends checking and keeping a log of your blood pressure 2-3 times per week.   It is best to check your blood pressure 1-2 hours after taking your medications.            Rest 10 minutes before taking your blood pressure.          Don't smoke or drink caffeinated beverages for at least 30 minutes before.          Take your blood pressure before (not after) you eat.          Sit comfortably with your  back supported and both feet on the floor (don't cross your legs).          Elevate your arm to heart level on a table or a desk.          Use the proper sized cuff. It should fit smoothly and snugly around your bare upper arm. There should be enough room to slip a fingertip under the cuff. The bottom edge of the cuff should be 1 inch above the crease of the elbow.

## 2023-03-13 ENCOUNTER — Encounter: Payer: Self-pay | Admitting: Internal Medicine

## 2023-05-09 ENCOUNTER — Other Ambulatory Visit: Payer: Self-pay | Admitting: Internal Medicine

## 2023-05-31 ENCOUNTER — Other Ambulatory Visit: Payer: Self-pay | Admitting: Internal Medicine

## 2023-07-22 ENCOUNTER — Other Ambulatory Visit: Payer: Self-pay | Admitting: Internal Medicine

## 2023-07-22 DIAGNOSIS — N529 Male erectile dysfunction, unspecified: Secondary | ICD-10-CM

## 2023-07-22 NOTE — Telephone Encounter (Signed)
*  STAT* If patient is at the pharmacy, call can be transferred to refill team.   1. Which medications need to be refilled? (please list name of each medication and dose if known) carvedilol (COREG) 6.25 MG tablet  nitroGLYCERIN (NITROSTAT) 0.4 MG SL tablet  rosuvastatin (CRESTOR) 40 MG tablet (Expired)  sildenafil (VIAGRA) 50 MG tablet  telmisartan (MICARDIS) 40 MG tablet   2. Which pharmacy/location (including street and city if local pharmacy) is medication to be sent to? [facility] - [place], [place] - [address]   3. Do they need a 30 day or 90 day supply? 90

## 2023-07-23 MED ORDER — ROSUVASTATIN CALCIUM 40 MG PO TABS: 40.0000 mg | ORAL_TABLET | Freq: Every day | ORAL | 0 refills | Status: DC

## 2023-07-23 MED ORDER — NITROGLYCERIN 0.4 MG SL SUBL: 0.4000 mg | SUBLINGUAL_TABLET | SUBLINGUAL | 0 refills | Status: AC | PRN

## 2023-07-23 MED ORDER — CARVEDILOL 6.25 MG PO TABS: 6.2500 mg | ORAL_TABLET | Freq: Two times a day (BID) | ORAL | 0 refills | Status: DC

## 2023-07-23 MED ORDER — SILDENAFIL CITRATE 50 MG PO TABS: 50.0000 mg | ORAL_TABLET | Freq: Every day | ORAL | 0 refills | Status: AC | PRN

## 2023-07-23 MED ORDER — TELMISARTAN 40 MG PO TABS: 40.0000 mg | ORAL_TABLET | Freq: Every day | ORAL | 0 refills | Status: DC

## 2023-08-14 ENCOUNTER — Other Ambulatory Visit: Payer: Self-pay | Admitting: Internal Medicine

## 2023-09-06 ENCOUNTER — Other Ambulatory Visit: Payer: Self-pay | Admitting: Internal Medicine

## 2023-09-25 ENCOUNTER — Other Ambulatory Visit: Payer: Self-pay | Admitting: Internal Medicine

## 2023-11-17 ENCOUNTER — Ambulatory Visit: Admitting: Cardiology

## 2024-01-12 ENCOUNTER — Other Ambulatory Visit: Payer: Self-pay | Admitting: Internal Medicine

## 2024-02-16 ENCOUNTER — Other Ambulatory Visit: Payer: Self-pay | Admitting: Internal Medicine

## 2024-02-17 NOTE — Telephone Encounter (Signed)
 PLEASE SCHEDULE APPOINTMENT WITH CARDIOLOGIST FOR FUTURE REFILLS  [phone]
